# Patient Record
Sex: Female | Born: 1980 | Race: Black or African American | Hispanic: No | Marital: Single | Smoking: Never smoker
Health system: Southern US, Community
[De-identification: ages and names within clinical notes are randomized; demographics above are authoritative.]

## PROBLEM LIST (undated history)

## (undated) DIAGNOSIS — T4145XA Adverse effect of unspecified anesthetic, initial encounter: Secondary | ICD-10-CM

## (undated) DIAGNOSIS — Z789 Other specified health status: Secondary | ICD-10-CM

## (undated) DIAGNOSIS — T8859XA Other complications of anesthesia, initial encounter: Secondary | ICD-10-CM

## (undated) DIAGNOSIS — Z98891 History of uterine scar from previous surgery: Secondary | ICD-10-CM

## (undated) DIAGNOSIS — N856 Intrauterine synechiae: Secondary | ICD-10-CM

## (undated) HISTORY — PX: DILATION AND CURETTAGE OF UTERUS: SHX78

## (undated) HISTORY — DX: Intrauterine synechiae: N85.6

## (undated) HISTORY — DX: Adverse effect of unspecified anesthetic, initial encounter: T41.45XA

## (undated) HISTORY — DX: Other complications of anesthesia, initial encounter: T88.59XA

---

## 2003-05-18 DIAGNOSIS — N856 Intrauterine synechiae: Secondary | ICD-10-CM

## 2003-05-18 HISTORY — DX: Intrauterine synechiae: N85.6

## 2006-08-27 ENCOUNTER — Emergency Department (HOSPITAL_COMMUNITY): Admission: EM | Admit: 2006-08-27 | Discharge: 2006-08-27 | Payer: Self-pay | Admitting: Family Medicine

## 2007-08-07 ENCOUNTER — Ambulatory Visit (HOSPITAL_COMMUNITY): Admission: RE | Admit: 2007-08-07 | Discharge: 2007-08-07 | Payer: Self-pay | Admitting: Obstetrics & Gynecology

## 2008-11-28 ENCOUNTER — Encounter (INDEPENDENT_AMBULATORY_CARE_PROVIDER_SITE_OTHER): Payer: Self-pay | Admitting: *Deleted

## 2008-12-12 ENCOUNTER — Other Ambulatory Visit: Admission: RE | Admit: 2008-12-12 | Discharge: 2008-12-12 | Payer: Self-pay | Admitting: Family Medicine

## 2008-12-12 ENCOUNTER — Ambulatory Visit: Payer: Self-pay | Admitting: Family Medicine

## 2008-12-12 ENCOUNTER — Encounter: Payer: Self-pay | Admitting: Family Medicine

## 2008-12-13 ENCOUNTER — Ambulatory Visit: Payer: Self-pay | Admitting: Family Medicine

## 2008-12-16 ENCOUNTER — Encounter (INDEPENDENT_AMBULATORY_CARE_PROVIDER_SITE_OTHER): Payer: Self-pay | Admitting: *Deleted

## 2008-12-16 LAB — CONVERTED CEMR LAB
AST: 16 units/L (ref 0–37)
Alkaline Phosphatase: 48 units/L (ref 39–117)
Bilirubin, Direct: 0 mg/dL (ref 0.0–0.3)
Chloride: 109 meq/L (ref 96–112)
Eosinophils Absolute: 0.2 10*3/uL (ref 0.0–0.7)
Eosinophils Relative: 2.6 % (ref 0.0–5.0)
GFR calc non Af Amer: 85.7 mL/min (ref >60)
LDL Cholesterol: 57 mg/dL (ref 0–99)
Lymphocytes Relative: 42.6 % (ref 12.0–46.0)
MCHC: 33.7 g/dL (ref 30.0–36.0)
MCV: 81.9 fL (ref 78.0–100.0)
Monocytes Absolute: 0.4 10*3/uL (ref 0.1–1.0)
Neutrophils Relative %: 47.7 % (ref 43.0–77.0)
Platelets: 244 10*3/uL (ref 150.0–400.0)
Potassium: 3.9 meq/L (ref 3.5–5.1)
Sodium: 140 meq/L (ref 135–145)
TSH: 0.88 microintl units/mL (ref 0.35–5.50)
Total Bilirubin: 0.7 mg/dL (ref 0.3–1.2)
Total CHOL/HDL Ratio: 2
VLDL: 13 mg/dL (ref 0.0–40.0)
WBC: 6.4 10*3/uL (ref 4.5–10.5)

## 2008-12-20 ENCOUNTER — Encounter (INDEPENDENT_AMBULATORY_CARE_PROVIDER_SITE_OTHER): Payer: Self-pay | Admitting: *Deleted

## 2009-03-25 ENCOUNTER — Ambulatory Visit: Payer: Self-pay | Admitting: Family Medicine

## 2009-03-25 DIAGNOSIS — D6489 Other specified anemias: Secondary | ICD-10-CM | POA: Insufficient documentation

## 2009-03-25 DIAGNOSIS — N926 Irregular menstruation, unspecified: Secondary | ICD-10-CM | POA: Insufficient documentation

## 2009-03-25 LAB — CONVERTED CEMR LAB
Bilirubin Urine: NEGATIVE
Nitrite: NEGATIVE
Protein, U semiquant: NEGATIVE
Urobilinogen, UA: 0.2
WBC Urine, dipstick: NEGATIVE

## 2010-02-02 ENCOUNTER — Ambulatory Visit (HOSPITAL_COMMUNITY): Admission: RE | Admit: 2010-02-02 | Discharge: 2010-02-02 | Payer: Self-pay | Admitting: Gynecology

## 2010-03-23 ENCOUNTER — Emergency Department (HOSPITAL_COMMUNITY): Admission: EM | Admit: 2010-03-23 | Discharge: 2010-03-23 | Payer: Self-pay | Admitting: Emergency Medicine

## 2010-10-27 LAB — ABO/RH: RH Type: POSITIVE

## 2010-10-27 LAB — RUBELLA ANTIBODY, IGM: Rubella: IMMUNE

## 2010-10-27 LAB — GC/CHLAMYDIA PROBE AMP, GENITAL: Chlamydia: NEGATIVE

## 2010-10-27 LAB — ANTIBODY SCREEN: Antibody Screen: NEGATIVE

## 2011-05-20 ENCOUNTER — Inpatient Hospital Stay (HOSPITAL_COMMUNITY)
Admission: AD | Admit: 2011-05-20 | Discharge: 2011-05-20 | Disposition: A | Discharge status: Home / Self Care | Payer: BC Managed Care – PPO | Source: Ambulatory Visit | Attending: Obstetrics and Gynecology | Admitting: Obstetrics and Gynecology

## 2011-05-20 ENCOUNTER — Encounter (HOSPITAL_COMMUNITY): Payer: Self-pay | Admitting: *Deleted

## 2011-05-20 DIAGNOSIS — T2102XA Burn of unspecified degree of abdominal wall, initial encounter: Secondary | ICD-10-CM | POA: Insufficient documentation

## 2011-05-20 DIAGNOSIS — Y92009 Unspecified place in unspecified non-institutional (private) residence as the place of occurrence of the external cause: Secondary | ICD-10-CM | POA: Insufficient documentation

## 2011-05-20 DIAGNOSIS — T300 Burn of unspecified body region, unspecified degree: Secondary | ICD-10-CM

## 2011-05-20 DIAGNOSIS — O99891 Other specified diseases and conditions complicating pregnancy: Secondary | ICD-10-CM | POA: Insufficient documentation

## 2011-05-20 DIAGNOSIS — X118XXA Contact with other hot tap-water, initial encounter: Secondary | ICD-10-CM | POA: Insufficient documentation

## 2011-05-20 HISTORY — DX: Other specified health status: Z78.9

## 2011-05-20 MED ORDER — SILVER SULFADIAZINE 1 % EX CREA: TOPICAL_CREAM | Freq: Two times a day (BID) | CUTANEOUS | Status: DC

## 2011-05-20 MED ORDER — SILVER SULFADIAZINE 1 % EX CREA
TOPICAL_CREAM | Freq: Once | CUTANEOUS | Status: AC
  Administered 2011-05-20: 23:00:00 via TOPICAL
  Filled 2011-05-20: qty 50
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
## 2011-05-20 NOTE — Progress Notes (Signed)
HAS BEEN HAVING UC- VE IN OFFICE - CLOSED
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
## 2011-05-21 ENCOUNTER — Telehealth (HOSPITAL_COMMUNITY): Payer: Self-pay | Admitting: *Deleted

## 2011-05-21 ENCOUNTER — Encounter (HOSPITAL_COMMUNITY): Payer: Self-pay | Admitting: Advanced Practice Midwife

## 2011-05-21 ENCOUNTER — Encounter (HOSPITAL_COMMUNITY): Payer: Self-pay | Admitting: *Deleted

## 2011-05-21 NOTE — Telephone Encounter (Signed)
Preadmission screen  

## 2011-05-24 ENCOUNTER — Inpatient Hospital Stay (HOSPITAL_COMMUNITY)
Admission: AD | Admit: 2011-05-24 | Discharge: 2011-05-29 | DRG: 371 | Disposition: A | Discharge status: Home / Self Care | Payer: BC Managed Care – PPO | Source: Ambulatory Visit | Attending: Obstetrics and Gynecology | Admitting: Obstetrics and Gynecology

## 2011-05-24 ENCOUNTER — Encounter (HOSPITAL_COMMUNITY): Payer: Self-pay | Admitting: *Deleted

## 2011-05-24 DIAGNOSIS — Z22330 Carrier of Group B streptococcus: Secondary | ICD-10-CM

## 2011-05-24 DIAGNOSIS — O99892 Other specified diseases and conditions complicating childbirth: Secondary | ICD-10-CM | POA: Diagnosis present

## 2011-05-24 DIAGNOSIS — Z98891 History of uterine scar from previous surgery: Secondary | ICD-10-CM

## 2011-05-24 HISTORY — DX: History of uterine scar from previous surgery: Z98.891

## 2011-05-24 LAB — CBC
HCT: 30.4 % — ABNORMAL LOW (ref 36.0–46.0)
Hemoglobin: 9.8 g/dL — ABNORMAL LOW (ref 12.0–15.0)
MCH: 23.4 pg — ABNORMAL LOW (ref 26.0–34.0)
MCV: 72.7 fL — ABNORMAL LOW (ref 78.0–100.0)
Platelets: 275 10*3/uL (ref 150–400)
RBC: 4.18 MIL/uL (ref 3.87–5.11)
WBC: 9.7 10*3/uL (ref 4.0–10.5)

## 2011-05-24 MED ORDER — PENICILLIN G POTASSIUM 5000000 UNITS IJ SOLR: 2.5000 10*6.[IU] | INTRAVENOUS | Status: DC

## 2011-05-24 MED ORDER — DINOPROSTONE 10 MG VA INST
10.0000 mg | VAGINAL_INSERT | Freq: Once | VAGINAL | Status: AC
  Administered 2011-05-24: 10 mg via VAGINAL
  Filled 2011-05-24: qty 1

## 2011-05-24 MED ORDER — ZOLPIDEM TARTRATE 10 MG PO TABS
10.0000 mg | ORAL_TABLET | Freq: Every evening | ORAL | Status: DC | PRN
  Administered 2011-05-24: 10 mg via ORAL
  Filled 2011-05-24: qty 1

## 2011-05-24 MED ORDER — OXYTOCIN 20 UNITS IN LACTATED RINGERS INFUSION - SIMPLE
125.0000 mL/h | Freq: Once | INTRAVENOUS | Status: DC
  Administered 2011-05-26: 125 mL/h via INTRAVENOUS

## 2011-05-24 MED ORDER — LACTATED RINGERS IV SOLN
500.0000 mL | INTRAVENOUS | Status: DC | PRN
  Administered 2011-05-25: 1000 mL via INTRAVENOUS
  Administered 2011-05-26: 500 mL via INTRAVENOUS

## 2011-05-24 MED ORDER — PENICILLIN G POTASSIUM 5000000 UNITS IJ SOLR: 5.0000 10*6.[IU] | Freq: Once | INTRAVENOUS | Status: DC

## 2011-05-24 MED ORDER — OXYTOCIN BOLUS FROM INFUSION
500.0000 mL | Freq: Once | INTRAVENOUS | Status: DC
  Filled 2011-05-24: qty 500

## 2011-05-24 MED ORDER — LIDOCAINE HCL (PF) 1 % IJ SOLN: 30.0000 mL | INTRAMUSCULAR | Status: DC | PRN

## 2011-05-24 MED ORDER — BUTORPHANOL TARTRATE 2 MG/ML IJ SOLN
1.0000 mg | INTRAMUSCULAR | Status: DC | PRN
  Administered 2011-05-25 (×2): 1 mg via INTRAVENOUS
  Filled 2011-05-24 (×3): qty 1

## 2011-05-24 MED ORDER — IBUPROFEN 600 MG PO TABS: 600.0000 mg | ORAL_TABLET | Freq: Four times a day (QID) | ORAL | Status: DC | PRN

## 2011-05-24 MED ORDER — ONDANSETRON HCL 4 MG/2ML IJ SOLN
4.0000 mg | Freq: Four times a day (QID) | INTRAMUSCULAR | Status: DC | PRN
  Administered 2011-05-25: 4 mg via INTRAVENOUS
  Filled 2011-05-24: qty 2

## 2011-05-24 MED ORDER — TERBUTALINE SULFATE 1 MG/ML IJ SOLN: 0.2500 mg | Freq: Once | INTRAMUSCULAR | Status: AC | PRN

## 2011-05-24 MED ORDER — LACTATED RINGERS IV SOLN
INTRAVENOUS | Status: DC
  Administered 2011-05-25 (×4): via INTRAVENOUS

## 2011-05-24 MED ORDER — ACETAMINOPHEN 325 MG PO TABS: 650.0000 mg | ORAL_TABLET | ORAL | Status: DC | PRN

## 2011-05-24 MED ORDER — OXYCODONE-ACETAMINOPHEN 5-325 MG PO TABS: 2.0000 | ORAL_TABLET | ORAL | Status: DC | PRN

## 2011-05-24 MED ORDER — FLEET ENEMA 7-19 GM/118ML RE ENEM: 1.0000 | ENEMA | RECTAL | Status: DC | PRN

## 2011-05-24 MED ORDER — CITRIC ACID-SODIUM CITRATE 334-500 MG/5ML PO SOLN
30.0000 mL | ORAL | Status: DC | PRN
  Administered 2011-05-25 – 2011-05-26 (×2): 30 mL via ORAL
  Filled 2011-05-24 (×2): qty 15

## 2011-05-25 ENCOUNTER — Inpatient Hospital Stay (HOSPITAL_COMMUNITY): Admission: RE | Admit: 2011-05-25 | Payer: Self-pay | Source: Ambulatory Visit

## 2011-05-25 ENCOUNTER — Encounter (HOSPITAL_COMMUNITY): Payer: Self-pay | Admitting: Anesthesiology

## 2011-05-25 MED ORDER — PHENYLEPHRINE 40 MCG/ML (10ML) SYRINGE FOR IV PUSH (FOR BLOOD PRESSURE SUPPORT): 80.0000 ug | PREFILLED_SYRINGE | INTRAVENOUS | Status: DC | PRN

## 2011-05-25 MED ORDER — PHENYLEPHRINE 40 MCG/ML (10ML) SYRINGE FOR IV PUSH (FOR BLOOD PRESSURE SUPPORT)
80.0000 ug | PREFILLED_SYRINGE | INTRAVENOUS | Status: DC | PRN
  Filled 2011-05-25: qty 5

## 2011-05-25 MED ORDER — FENTANYL 2.5 MCG/ML BUPIVACAINE 1/10 % EPIDURAL INFUSION (WH - ANES)
INTRAMUSCULAR | Status: DC | PRN
  Administered 2011-05-25: 14 mL/h via EPIDURAL

## 2011-05-25 MED ORDER — OXYTOCIN 20 UNITS IN LACTATED RINGERS INFUSION - SIMPLE
1.0000 m[IU]/min | INTRAVENOUS | Status: DC
Start: 2011-05-25 — End: 2011-05-26
  Administered 2011-05-25: 19 m[IU]/min via INTRAVENOUS
  Administered 2011-05-25: 11 m[IU]/min via INTRAVENOUS
  Administered 2011-05-25: 1 m[IU]/min via INTRAVENOUS
  Filled 2011-05-25: qty 1000

## 2011-05-25 MED ORDER — PENICILLIN G POTASSIUM 5000000 UNITS IJ SOLR
5.0000 10*6.[IU] | Freq: Once | INTRAVENOUS | Status: AC
  Administered 2011-05-25: 5 10*6.[IU] via INTRAVENOUS
  Filled 2011-05-25: qty 5

## 2011-05-25 MED ORDER — PENICILLIN G POTASSIUM 5000000 UNITS IJ SOLR
2.5000 10*6.[IU] | INTRAMUSCULAR | Status: DC
  Administered 2011-05-25 – 2011-05-26 (×4): 2.5 10*6.[IU] via INTRAVENOUS
  Filled 2011-05-25 (×8): qty 2.5

## 2011-05-25 MED ORDER — EPHEDRINE 5 MG/ML INJ: 10.0000 mg | INTRAVENOUS | Status: DC | PRN

## 2011-05-25 MED ORDER — TERBUTALINE SULFATE 1 MG/ML IJ SOLN: 0.2500 mg | Freq: Once | INTRAMUSCULAR | Status: AC | PRN

## 2011-05-25 MED ORDER — LACTATED RINGERS IV SOLN: 500.0000 mL | Freq: Once | INTRAVENOUS | Status: DC

## 2011-05-25 MED ORDER — LIDOCAINE HCL 1.5 % IJ SOLN
INTRAMUSCULAR | Status: DC | PRN
  Administered 2011-05-25: 5 mL via EPIDURAL
  Administered 2011-05-25: 4 mL via EPIDURAL

## 2011-05-25 MED ORDER — DIPHENHYDRAMINE HCL 50 MG/ML IJ SOLN
12.5000 mg | INTRAMUSCULAR | Status: DC | PRN
Start: 2011-05-25 — End: 2011-05-26

## 2011-05-25 MED ORDER — FENTANYL 2.5 MCG/ML BUPIVACAINE 1/10 % EPIDURAL INFUSION (WH - ANES)
14.0000 mL/h | INTRAMUSCULAR | Status: DC
  Administered 2011-05-25 (×2): 14 mL/h via EPIDURAL
  Filled 2011-05-25 (×4): qty 60

## 2011-05-25 MED ORDER — EPHEDRINE 5 MG/ML INJ
10.0000 mg | INTRAVENOUS | Status: DC | PRN
  Filled 2011-05-25: qty 4

## 2011-05-25 NOTE — Progress Notes (Signed)
Patient ID: Gabrielle Rodgers, female   DOB: Feb 05, 1981, 31 y.o.   MRN: [id number] Pitocin at 19 mu/ minute and contractions q 2-4 and 1/2 minutes. Cervix 4 cm 80% effaced and the vertex is at -1 station Will continue to increase pitocin.

## 2011-05-25 NOTE — H&P (Signed)
Gabrielle Rodgers, LEHAN              ACCOUNT NO.:  [id number]  MEDICAL RECORD NO.:  [id number]  LOCATION:                                 FACILITY:  PHYSICIAN:  Malachi Pro. Ambrose Mantle, M.D. DATE OF BIRTH:  [birth year]  DATE OF ADMISSION:  05/24/2011 DATE OF DISCHARGE:                             HISTORY & PHYSICAL   PRESENT ILLNESS:  This is a 31 year old, African American female, para 0- 0-3-0, gravida 4, last menstrual period August 14, 2010, [facility] May 19, 2011, by an ultrasound at 9 weeks done on Oct 14, 2010.  Blood group and type O positive, negative antibody.  Pap smear normal.  Rubella immune. RPR nonreactive.  Hepatitis B surface antigen negative, HIV negative, hemoglobin, electrophoresis AA.  GC and Chlamydia negative.  Cystic fibrosis screening declined.  First trimester screen negative, AFP negative, group B strep positive, 1-hour Glucola 100.  The patient began her prenatal course with our office on October 27, 2010.  She was treated with MetroGel for BV early in pregnancy and she was then given a prescription for Diflucan.  She was also given Vicodin for headaches to take on an as needed basis for rescue if Tylenol does not help. Followup ultrasound at 18 weeks was normal.  She did have problems with carpal tunnel syndrome and was advised to use wrist splints.  She was later treated with metronidazole for BV on April 21, 2011.  She was advised of an 8 pounds weight gain in 2 weeks and advised a proper diet. She did have positive group B strep and plan is to treat her during labor.  We began her nonstress tests at her due date.  They have been reactive.  Her cervix has remained closed and only 30% effaced, but she is now at 41 weeks and she is admitted for induction to begin Cervidil on the night of admission.  PAST MEDICAL HISTORY:  No known drug allergies.  No latex allergy.  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  She has had a D and  C.  MEDICATIONS:  Prenatal vitamins.  OBSTETRIC HISTORY:  She has had 3 terminations and had a repeat D and C for retained products after the 1 in 2005.  FAMILY HISTORY:  Her mother has fibroids.  Father prostate cancer. Maternal grandmother with diabetes and alcoholism. Father and paternal grandmother have colon polyps.  Maternal grandmother with heart disease. Maternal aunt with multiple sclerosis and paternal uncle with lung cancer.  PHYSICAL EXAMINATION:  GENERAL:  A well-developed, well-nourished black female, in no distress. VITAL SIGNS:  Blood pressure 140/84, weight is 208.5 pounds. HEAD, EYES, NOSE AND THROAT:  Normal. HEART:  Normal size and sounds.  No murmurs. LUNGS:  Clear to auscultation.  Fundal height 39 cm.  Fetal heart tones normal.  Cervix 0 cm dilated, 30% effaced, vertex, at a -3.  The patient is being admitted for Cervidil and will undergo Pitocin induction on the day following admission.     Malachi Pro. Ambrose Mantle, M.D.     TFH/MEDQ  D:  05/24/2011  T:  05/24/2011  Job:  [id number]

## 2011-05-25 NOTE — Progress Notes (Signed)
Patient ID: Gabrielle Rodgers, female   DOB: 01/06/1981, 31 y.o.   MRN: [id number] Cervidil was placed last night at 10 PM Cervix FT dilated and 30-40% effaced and the vertex is at - 3 station. The cervidil was removed and we will start pitocin.

## 2011-05-25 NOTE — Anesthesia Procedure Notes (Signed)
Epidural Patient location during procedure: OB Start time: 05/25/2011 2:32 PM  Staffing Anesthesiologist: Khaniyah Bezek A. Performed by: anesthesiologist   Preanesthetic Checklist Completed: patient identified, site marked, surgical consent, pre-op evaluation, timeout performed, IV checked, risks and benefits discussed and monitors and equipment checked  Epidural Patient position: sitting Prep: site prepped and draped and DuraPrep Patient monitoring: continuous pulse ox and blood pressure Approach: midline Injection technique: LOR air  Needle:  Needle type: Tuohy  Needle gauge: 17 G Needle length: 9 cm Needle insertion depth: 5 cm cm Catheter type: closed end flexible Catheter size: 19 Gauge Catheter at skin depth: 10 cm Test dose: negative and 1.5% lidocaine  Assessment Events: blood not aspirated, injection not painful, no injection resistance, negative IV test and no paresthesia  Additional Notes Patient is more comfortable after epidural dosed. Please see RN's note for documentation of vital signs and FHR which are stable.

## 2011-05-25 NOTE — Progress Notes (Signed)
Patient ID: Gabrielle Rodgers, female   DOB: 01-Jul-1980, 31 y.o.   MRN: [id number] Pitocin at 17 mu/ minute. Gabrielle Rodgers has an epidural and is comfortable. Cervix 3-4 cm 70% effaced and the vertex is at - 2 station. The Gabrielle Rodgers was not examined for 4 hours so will increase the pitocin and check the cervix q 1 h

## 2011-05-25 NOTE — Progress Notes (Signed)
Patient ID: Gabrielle Rodgers, female   DOB: 1981/01/16, 31 y.o.   MRN: [id number] Pitocin at 11 mu/ minute and contractions are q 2-4 minutes. Cervix is 3 cm 80 % effaced and the vertex is at -2 station.AROM produced dark meconium stained fluid.

## 2011-05-25 NOTE — Progress Notes (Signed)
Patient ID: Gabrielle Rodgers, female   DOB: 1980-06-08, 31 y.o.   MRN: [id number] Pitocin is at 23 mu/ minute and the contractions are q  2- 3 and 1/2 minujtes The cervix is 4-5 cm 80 % effaced and the vertex is at -1

## 2011-05-25 NOTE — Progress Notes (Signed)
Patient ID: Gabrielle Rodgers, female   DOB: [birth year], 31 y.o.   MRN: [id number] With 21 mu/ minute of pitocin the contractions seem adequate and the pt feels pressure with the contractions on her perineum but the cervix is still 4 cm 80% effaced and the vertex is at -1 station.

## 2011-05-25 NOTE — Anesthesia Preprocedure Evaluation (Signed)
Anesthesia Evaluation  Patient identified by MRN, date of birth, ID band Patient awake    Reviewed: Allergy & Precautions, H&P , Patient's Chart, lab work & pertinent test results  Airway Mallampati: III TM Distance: >3 FB Neck ROM: full    Dental No notable dental hx. (+) Teeth Intact   Pulmonary neg pulmonary ROS,  clear to auscultation  Pulmonary exam normal       Cardiovascular neg cardio ROS regular Normal    Neuro/Psych Negative Neurological ROS  Negative Psych ROS   GI/Hepatic negative GI ROS, Neg liver ROS,   Endo/Other  Negative Endocrine ROS  Renal/GU negative Renal ROS  Genitourinary negative   Musculoskeletal   Abdominal Normal abdominal exam  (+)   Peds  Hematology negative hematology ROS (+)   Anesthesia Other Findings   Reproductive/Obstetrics (+) Pregnancy                           Anesthesia Physical Anesthesia Plan  ASA: II  Anesthesia Plan: Epidural   Post-op Pain Management:    Induction:   Airway Management Planned:   Additional Equipment:   Intra-op Plan:   Post-operative Plan:   Informed Consent: I have reviewed the patients History and Physical, chart, labs and discussed the procedure including the risks, benefits and alternatives for the proposed anesthesia with the patient or authorized representative who has indicated his/her understanding and acceptance.     Plan Discussed with: Anesthesiologist  Anesthesia Plan Comments:         Anesthesia Quick Evaluation  

## 2011-05-26 ENCOUNTER — Other Ambulatory Visit: Payer: Self-pay | Admitting: Obstetrics and Gynecology

## 2011-05-26 ENCOUNTER — Encounter (HOSPITAL_COMMUNITY): Payer: Self-pay | Admitting: *Deleted

## 2011-05-26 ENCOUNTER — Encounter (HOSPITAL_COMMUNITY)
Admission: AD | Disposition: A | Discharge status: Home / Self Care | Payer: Self-pay | Source: Ambulatory Visit | Attending: Obstetrics and Gynecology

## 2011-05-26 ENCOUNTER — Encounter (HOSPITAL_COMMUNITY): Payer: Self-pay | Admitting: Anesthesiology

## 2011-05-26 ENCOUNTER — Inpatient Hospital Stay (HOSPITAL_COMMUNITY): Payer: BC Managed Care – PPO | Admitting: Anesthesiology

## 2011-05-26 SURGERY — Surgical Case
Anesthesia: Epidural | Site: Abdomen | Wound class: Clean Contaminated

## 2011-05-26 MED ORDER — MEPERIDINE HCL 25 MG/ML IJ SOLN
INTRAMUSCULAR | Status: DC | PRN
  Administered 2011-05-26: 25 mg via INTRAVENOUS

## 2011-05-26 MED ORDER — ONDANSETRON HCL 4 MG/2ML IJ SOLN
INTRAMUSCULAR | Status: DC | PRN
  Administered 2011-05-26: 4 mg via INTRAVENOUS

## 2011-05-26 MED ORDER — ONDANSETRON HCL 4 MG/2ML IJ SOLN: 4.0000 mg | Freq: Three times a day (TID) | INTRAMUSCULAR | Status: DC | PRN

## 2011-05-26 MED ORDER — SODIUM BICARBONATE 8.4 % IV SOLN
INTRAVENOUS | Status: AC
  Filled 2011-05-26: qty 50

## 2011-05-26 MED ORDER — SCOPOLAMINE 1 MG/3DAYS TD PT72
1.0000 | MEDICATED_PATCH | Freq: Once | TRANSDERMAL | Status: AC
  Administered 2011-05-26: 1.5 mg via TRANSDERMAL

## 2011-05-26 MED ORDER — SODIUM CHLORIDE 0.9 % IV SOLN
1.0000 ug/kg/h | INTRAVENOUS | Status: DC | PRN
  Filled 2011-05-26: qty 2.5

## 2011-05-26 MED ORDER — WITCH HAZEL-GLYCERIN EX PADS: 1.0000 "application " | MEDICATED_PAD | CUTANEOUS | Status: DC | PRN

## 2011-05-26 MED ORDER — PHENYLEPHRINE HCL 10 MG/ML IJ SOLN
INTRAMUSCULAR | Status: DC | PRN
  Administered 2011-05-26: 80 ug via INTRAVENOUS

## 2011-05-26 MED ORDER — OXYTOCIN 10 UNIT/ML IJ SOLN
INTRAMUSCULAR | Status: AC
  Filled 2011-05-26: qty 4

## 2011-05-26 MED ORDER — MORPHINE SULFATE (PF) 0.5 MG/ML IJ SOLN
INTRAMUSCULAR | Status: DC | PRN
  Administered 2011-05-26: 4 mg via EPIDURAL
  Administered 2011-05-26: 1 mg via INTRAVENOUS

## 2011-05-26 MED ORDER — CEFAZOLIN SODIUM 1-5 GM-% IV SOLN
1.0000 g | Freq: Three times a day (TID) | INTRAVENOUS | Status: AC
  Administered 2011-05-26 (×2): 1 g via INTRAVENOUS
  Filled 2011-05-26 (×4): qty 50

## 2011-05-26 MED ORDER — SODIUM CHLORIDE 0.9 % IJ SOLN: 3.0000 mL | INTRAMUSCULAR | Status: DC | PRN

## 2011-05-26 MED ORDER — TETANUS-DIPHTH-ACELL PERTUSSIS 5-2.5-18.5 LF-MCG/0.5 IM SUSP
0.5000 mL | Freq: Once | INTRAMUSCULAR | Status: AC
  Administered 2011-05-27: 0.5 mL via INTRAMUSCULAR
  Filled 2011-05-26: qty 0.5

## 2011-05-26 MED ORDER — DIPHENHYDRAMINE HCL 25 MG PO CAPS
25.0000 mg | ORAL_CAPSULE | ORAL | Status: DC | PRN
  Administered 2011-05-26: 25 mg via ORAL
  Filled 2011-05-26 (×2): qty 1

## 2011-05-26 MED ORDER — SODIUM BICARBONATE 8.4 % IV SOLN
INTRAVENOUS | Status: DC | PRN
  Administered 2011-05-26: 5 mL via EPIDURAL

## 2011-05-26 MED ORDER — OXYTOCIN 20 UNITS IN LACTATED RINGERS INFUSION - SIMPLE
INTRAVENOUS | Status: AC
  Administered 2011-05-26: 20 [IU] via INTRAVENOUS
  Filled 2011-05-26: qty 1000

## 2011-05-26 MED ORDER — NALBUPHINE HCL 10 MG/ML IJ SOLN
5.0000 mg | INTRAMUSCULAR | Status: DC | PRN
  Administered 2011-05-26: 10 mg via SUBCUTANEOUS
  Administered 2011-05-26: 5 mg via SUBCUTANEOUS
  Administered 2011-05-26: 10 mg via SUBCUTANEOUS
  Filled 2011-05-26 (×4): qty 1

## 2011-05-26 MED ORDER — ZOLPIDEM TARTRATE 5 MG PO TABS: 5.0000 mg | ORAL_TABLET | Freq: Every evening | ORAL | Status: DC | PRN

## 2011-05-26 MED ORDER — CHLOROPROCAINE HCL 3 % IJ SOLN
INTRAMUSCULAR | Status: AC
Start: 2011-05-26 — End: 2011-05-26
  Filled 2011-05-26: qty 20

## 2011-05-26 MED ORDER — LIDOCAINE HCL (PF) 1 % IJ SOLN
INTRAMUSCULAR | Status: DC | PRN
  Administered 2011-05-26: 7 mL

## 2011-05-26 MED ORDER — LACTATED RINGERS IV SOLN
INTRAVENOUS | Status: DC | PRN
  Administered 2011-05-26 (×3): via INTRAVENOUS

## 2011-05-26 MED ORDER — ONDANSETRON HCL 4 MG/2ML IJ SOLN
INTRAMUSCULAR | Status: AC
  Filled 2011-05-26: qty 2

## 2011-05-26 MED ORDER — PHENYLEPHRINE 40 MCG/ML (10ML) SYRINGE FOR IV PUSH (FOR BLOOD PRESSURE SUPPORT)
PREFILLED_SYRINGE | INTRAVENOUS | Status: AC
  Filled 2011-05-26: qty 5

## 2011-05-26 MED ORDER — MENTHOL 3 MG MT LOZG: 1.0000 | LOZENGE | OROMUCOSAL | Status: DC | PRN

## 2011-05-26 MED ORDER — MEPERIDINE HCL 25 MG/ML IJ SOLN: 6.2500 mg | INTRAMUSCULAR | Status: DC | PRN

## 2011-05-26 MED ORDER — LIDOCAINE-EPINEPHRINE (PF) 2 %-1:200000 IJ SOLN
INTRAMUSCULAR | Status: AC
  Filled 2011-05-26: qty 20

## 2011-05-26 MED ORDER — KETOROLAC TROMETHAMINE 60 MG/2ML IM SOLN
60.0000 mg | Freq: Once | INTRAMUSCULAR | Status: AC | PRN
  Administered 2011-05-26: 60 mg via INTRAMUSCULAR

## 2011-05-26 MED ORDER — DIPHENHYDRAMINE HCL 50 MG/ML IJ SOLN: 25.0000 mg | INTRAMUSCULAR | Status: DC | PRN

## 2011-05-26 MED ORDER — SIMETHICONE 80 MG PO CHEW
80.0000 mg | CHEWABLE_TABLET | Freq: Three times a day (TID) | ORAL | Status: DC
  Administered 2011-05-26 – 2011-05-29 (×10): 80 mg via ORAL

## 2011-05-26 MED ORDER — IBUPROFEN 600 MG PO TABS: 600.0000 mg | ORAL_TABLET | Freq: Four times a day (QID) | ORAL | Status: DC | PRN

## 2011-05-26 MED ORDER — METOCLOPRAMIDE HCL 5 MG/ML IJ SOLN: 10.0000 mg | Freq: Three times a day (TID) | INTRAMUSCULAR | Status: DC | PRN

## 2011-05-26 MED ORDER — SODIUM CHLORIDE 0.9 % IR SOLN
Status: DC | PRN
  Administered 2011-05-26: 1000 mL

## 2011-05-26 MED ORDER — CEFAZOLIN SODIUM 1-5 GM-% IV SOLN
INTRAVENOUS | Status: AC
  Filled 2011-05-26: qty 100

## 2011-05-26 MED ORDER — SILVER SULFADIAZINE 1 % EX CREA
TOPICAL_CREAM | Freq: Two times a day (BID) | CUTANEOUS | Status: DC
  Administered 2011-05-26 – 2011-05-28 (×5): via TOPICAL
  Filled 2011-05-26: qty 50

## 2011-05-26 MED ORDER — OXYTOCIN 20 UNITS IN LACTATED RINGERS INFUSION - SIMPLE
INTRAVENOUS | Status: DC | PRN
  Administered 2011-05-26 (×2): 20 [IU] via INTRAVENOUS

## 2011-05-26 MED ORDER — KETOROLAC TROMETHAMINE 30 MG/ML IJ SOLN: 30.0000 mg | Freq: Four times a day (QID) | INTRAMUSCULAR | Status: AC | PRN

## 2011-05-26 MED ORDER — MORPHINE SULFATE 0.5 MG/ML IJ SOLN
INTRAMUSCULAR | Status: AC
  Filled 2011-05-26: qty 10

## 2011-05-26 MED ORDER — OXYCODONE-ACETAMINOPHEN 5-325 MG PO TABS
1.0000 | ORAL_TABLET | ORAL | Status: DC | PRN
  Administered 2011-05-26 – 2011-05-29 (×8): 1 via ORAL
  Filled 2011-05-26 (×9): qty 1

## 2011-05-26 MED ORDER — LACTATED RINGERS IV SOLN
INTRAVENOUS | Status: DC
Start: 2011-05-26 — End: 2011-05-29
  Administered 2011-05-26: 15:00:00 via INTRAVENOUS

## 2011-05-26 MED ORDER — MEASLES, MUMPS & RUBELLA VAC ~~LOC~~ INJ
0.5000 mL | INJECTION | Freq: Once | SUBCUTANEOUS | Status: DC
  Filled 2011-05-26: qty 0.5

## 2011-05-26 MED ORDER — LANOLIN HYDROUS EX OINT: 1.0000 "application " | TOPICAL_OINTMENT | CUTANEOUS | Status: DC | PRN

## 2011-05-26 MED ORDER — ONDANSETRON HCL 4 MG PO TABS: 4.0000 mg | ORAL_TABLET | ORAL | Status: DC | PRN

## 2011-05-26 MED ORDER — ONDANSETRON HCL 4 MG/2ML IJ SOLN: 4.0000 mg | INTRAMUSCULAR | Status: DC | PRN

## 2011-05-26 MED ORDER — DIPHENHYDRAMINE HCL 50 MG/ML IJ SOLN
12.5000 mg | INTRAMUSCULAR | Status: DC | PRN
  Administered 2011-05-26: 12.5 mg via INTRAVENOUS

## 2011-05-26 MED ORDER — MEPERIDINE HCL 25 MG/ML IJ SOLN
INTRAMUSCULAR | Status: AC
  Filled 2011-05-26: qty 1

## 2011-05-26 MED ORDER — SENNOSIDES-DOCUSATE SODIUM 8.6-50 MG PO TABS
2.0000 | ORAL_TABLET | Freq: Every day | ORAL | Status: DC
  Administered 2011-05-26 – 2011-05-28 (×3): 2 via ORAL

## 2011-05-26 MED ORDER — IBUPROFEN 600 MG PO TABS
600.0000 mg | ORAL_TABLET | Freq: Four times a day (QID) | ORAL | Status: DC
  Administered 2011-05-26 – 2011-05-29 (×11): 600 mg via ORAL
  Filled 2011-05-26 (×13): qty 1

## 2011-05-26 MED ORDER — DIBUCAINE 1 % RE OINT: 1.0000 "application " | TOPICAL_OINTMENT | RECTAL | Status: DC | PRN

## 2011-05-26 MED ORDER — OXYTOCIN 20 UNITS IN LACTATED RINGERS INFUSION - SIMPLE: 125.0000 mL/h | INTRAVENOUS | Status: AC

## 2011-05-26 MED ORDER — KETOROLAC TROMETHAMINE 60 MG/2ML IM SOLN
INTRAMUSCULAR | Status: AC
  Filled 2011-05-26: qty 2

## 2011-05-26 MED ORDER — CEFAZOLIN SODIUM 1-5 GM-% IV SOLN
INTRAVENOUS | Status: DC | PRN
  Administered 2011-05-26: 2 g via INTRAVENOUS

## 2011-05-26 MED ORDER — NALOXONE HCL 0.4 MG/ML IJ SOLN: 0.4000 mg | INTRAMUSCULAR | Status: DC | PRN

## 2011-05-26 MED ORDER — SCOPOLAMINE 1 MG/3DAYS TD PT72
MEDICATED_PATCH | TRANSDERMAL | Status: AC
  Filled 2011-05-26: qty 1

## 2011-05-26 MED ORDER — NALBUPHINE SYRINGE 5 MG/0.5 ML
5.0000 mg | INJECTION | INTRAMUSCULAR | Status: DC | PRN
  Administered 2011-05-26: 10 mg via INTRAVENOUS
  Filled 2011-05-26 (×3): qty 1

## 2011-05-26 MED ORDER — SIMETHICONE 80 MG PO CHEW: 80.0000 mg | CHEWABLE_TABLET | ORAL | Status: DC | PRN

## 2011-05-26 MED ORDER — CEFAZOLIN SODIUM-DEXTROSE 2-3 GM-% IV SOLR: 2.0000 g | Freq: Once | INTRAVENOUS | Status: DC

## 2011-05-26 MED ORDER — DIPHENHYDRAMINE HCL 25 MG PO CAPS: 25.0000 mg | ORAL_CAPSULE | Freq: Four times a day (QID) | ORAL | Status: DC | PRN

## 2011-05-26 MED ORDER — DIPHENHYDRAMINE HCL 50 MG/ML IJ SOLN
INTRAMUSCULAR | Status: AC
  Filled 2011-05-26: qty 1

## 2011-05-26 SURGICAL SUPPLY — 36 items
CLOTH BEACON ORANGE TIMEOUT ST (SAFETY) ×2 IMPLANT
CONTAINER PREFILL 10% NBF 15ML (MISCELLANEOUS) IMPLANT
DRESSING TELFA 8X3 (GAUZE/BANDAGES/DRESSINGS) IMPLANT
DRSG VASELINE 3X18 (GAUZE/BANDAGES/DRESSINGS) ×2 IMPLANT
ELECT REM PT RETURN 9FT ADLT (ELECTROSURGICAL) ×2
ELECTRODE REM PT RTRN 9FT ADLT (ELECTROSURGICAL) ×1 IMPLANT
EXTRACTOR VACUUM KIWI (MISCELLANEOUS) IMPLANT
EXTRACTOR VACUUM M CUP 4 TUBE (SUCTIONS) IMPLANT
GAUZE SPONGE 4X4 12PLY STRL LF (GAUZE/BANDAGES/DRESSINGS) ×3 IMPLANT
GAUZE VASELINE 3X9 (GAUZE/BANDAGES/DRESSINGS) ×1 IMPLANT
GLOVE BIO SURGEON STRL SZ7.5 (GLOVE) ×4 IMPLANT
GLOVE SKINSENSE NS SZ7.5 (GLOVE) ×1
GLOVE SKINSENSE NS SZ8.0 LF (GLOVE) ×1
GLOVE SKINSENSE STRL SZ7.5 (GLOVE) IMPLANT
GLOVE SKINSENSE STRL SZ8.0 LF (GLOVE) IMPLANT
GOWN PREVENTION PLUS LG XLONG (DISPOSABLE) ×4 IMPLANT
GOWN PREVENTION PLUS XLARGE (GOWN DISPOSABLE) ×2 IMPLANT
KIT ABG SYR 3ML LUER SLIP (SYRINGE) IMPLANT
NDL HYPO 25X5/8 SAFETYGLIDE (NEEDLE) ×1 IMPLANT
NEEDLE HYPO 25X5/8 SAFETYGLIDE (NEEDLE) IMPLANT
NS IRRIG 1000ML POUR BTL (IV SOLUTION) ×2 IMPLANT
PACK C SECTION WH (CUSTOM PROCEDURE TRAY) ×2 IMPLANT
PAD ABD 7.5X8 STRL (GAUZE/BANDAGES/DRESSINGS) IMPLANT
RTRCTR C-SECT PINK 25CM LRG (MISCELLANEOUS) ×1 IMPLANT
SLEEVE SCD COMPRESS KNEE MED (MISCELLANEOUS) ×1 IMPLANT
STAPLER VISISTAT 35W (STAPLE) ×1 IMPLANT
SUT PLAIN 0 NONE (SUTURE) IMPLANT
SUT VIC AB 0 CT1 36 (SUTURE) ×16 IMPLANT
SUT VIC AB 3-0 CTX 36 (SUTURE) ×2 IMPLANT
SUT VIC AB 3-0 SH 27 (SUTURE)
SUT VIC AB 3-0 SH 27X BRD (SUTURE) IMPLANT
SUT VIC AB 4-0 KS 27 (SUTURE) IMPLANT
SUT VICRYL 0 TIES 12 18 (SUTURE) IMPLANT
TOWEL OR 17X24 6PK STRL BLUE (TOWEL DISPOSABLE) ×4 IMPLANT
TRAY FOLEY CATH 14FR (SET/KITS/TRAYS/PACK) ×1 IMPLANT
WATER STERILE IRR 1000ML POUR (IV SOLUTION) ×1 IMPLANT

## 2011-05-26 NOTE — Preoperative (Signed)
Beta Blockers   Reason not to administer Beta Blockers:Not Applicable 

## 2011-05-26 NOTE — Progress Notes (Signed)
UR chart review completed.  

## 2011-05-26 NOTE — Anesthesia Postprocedure Evaluation (Signed)
Anesthesia Post Note  Patient: Gabrielle Rodgers  Procedure(s) Performed:  CESAREAN SECTION - Primary Cesarean Section Delivery Boy @ [phone], Apgars 9/10  Anesthesia type: Epidural  Patient location: PACU  Post pain: Pain level controlled  Post assessment: Post-op Vital signs reviewed  Last Vitals:  Filed Vitals:   05/26/11 0445  BP: 127/78  Pulse: 91  Temp:   Resp: 18    Post vital signs: stable  Level of consciousness: awake  Complications: No apparent anesthesia complications

## 2011-05-26 NOTE — Anesthesia Postprocedure Evaluation (Signed)
  Anesthesia Post-op Note  Patient: Gabrielle Rodgers  Procedure(s) Performed:  CESAREAN SECTION - Primary Cesarean Section Delivery Boy @ [phone], Apgars 9/10  Patient Location: Mother/Baby  Anesthesia Type: Epidural  Level of Consciousness: alert  and oriented  Airway and Oxygen Therapy: Patient Spontanous Breathing  Post-op Pain: mild  Post-op Assessment: Patient's Cardiovascular Status Stable and Respiratory Function Stable  Post-op Vital Signs: stable  Complications: No apparent anesthesia complications

## 2011-05-26 NOTE — Transfer of Care (Signed)
Immediate Anesthesia Transfer of Care Note  Patient: Gabrielle Rodgers  Procedure(s) Performed:  CESAREAN SECTION - Primary Cesarean Section Delivery Boy @ [phone], Apgars 9/10  Patient Location: PACU  Anesthesia Type: Epidural  Level of Consciousness: awake, alert , oriented and patient cooperative  Airway & Oxygen Therapy: Patient Spontanous Breathing  Post-op Assessment: Report given to PACU RN  Post vital signs: Reviewed  Complications: No apparent anesthesia complications

## 2011-05-26 NOTE — Addendum Note (Signed)
Addendum  created 05/26/11 1527 by Fanny Dance   Modules edited:Notes Section

## 2011-05-26 NOTE — Plan of Care (Signed)
Problem: Phase I Progression Outcomes Goal: Adequate progression of labor Outcome: Not Progressing C/S for failure to progress

## 2011-05-26 NOTE — Op Note (Signed)
Operative note on Gabrielle Rodgers:  Preoperative diagnosis: Intrauterine pregnancy 41 weeks failure to progress in labor  Postoperative diagnosis: Same, occiput posterior  Operation: Low transverse cervical C-section  Operator: Astra Gregg   Anesthesia: Epidural Dr. Rodman Pickle  Date of the operation 05/26/2011  The patient was brought to the operating room and the epidural was boosted and after anesthesia was satisfactory the abdomen was prepped with Betadine solution and draped as a sterile field. A timeout was done. A transverse incision was made suprapubically and carried in layers through the skin subcutaneous tissue and fascia the fascia was separated from the rectus muscle superiorly and inferiorly and the peritoneum was opened the peritoneum was stretched and the Alexis retractor was inserted into the abdominal cavity exposing the lower uterine segment. A short transverse incision was made through the superficial layers of the myometrium in the lower uterine segment I entered the amniotic sac with my finger and meconium-stained fluid was released. The incision was over the shoulder Sahr reached down in the pelvis rotated the babies head to transverse and delivered the vertex. The nose and pharynx were suctioned with the bulb. I had asked Dr. Eric Form prior to surgery If he wanted me to use the DeLee and he stated he did not. The remainder of the baby was delivered, the cord was clamped, and the infant was given to Dr. Dorene Grebe who was in attendance. ESI in the 8 lbs. 13 oz. Female infant Apgars of 9 at one and 10 at 5 minutes. The placenta was removed, all membranes were removed, the inside of the uterus was palpated and found to be free of any remaining products of conception, and the uterine incision was closed with 2 sutures of 0 Vicryl locking the first layer and a nonlocking imbricating suture on the second layer. Both tubes and ovaries and the uterus appeared normal, Liberal irrigation confirmed  hemostasis, the Alexis retractor was removed And the abdominal wall was closed in layers using interrupted sutures of 0 Vicryl to close the rectus muscle and peritoneum in one layer 2 running sutures of 0 Vicryl on the fascia a running 3-0 Vicryl in the subcutaneous tissue and staples on the skin The patient complained of a lot of pain as I was closing the abdominal wall so Nesacaine was poured into the incision and about 8 cc of 1% Xylocaine were injected into the subcutaneous tissue.The procedure was terminated, estimated blood loss about 1000 cc. Sponge and needle counts were correct and she was returned to recovery in satisfactory condition. During the procedure I identified the bladder and it was well below the area of suturing

## 2011-05-26 NOTE — Progress Notes (Signed)
Patient ID: Gabrielle Rodgers, female   DOB: 11-26-1980, 31 y.o.   MRN: [id number] DOS afebrile BP normal No complaints Output good

## 2011-05-26 NOTE — Progress Notes (Signed)
Patient ID: Gabrielle Rodgers, female   DOB: 11/17/1980, 31 y.o.   MRN: [id number] An entry was made at midnight by paper stating that I had advised Csection but the pt declined stating she wanted to wait until 3 AM Since then the baby has begun to have slight decelerations that are somewhat  Late in timing. The cervix is 4-5 cm 80% effaced and the vertex is at -1 station. We will proceed with C section.

## 2011-05-27 ENCOUNTER — Encounter (HOSPITAL_COMMUNITY): Payer: Self-pay | Admitting: Obstetrics and Gynecology

## 2011-05-27 LAB — CBC
MCH: 23.7 pg — ABNORMAL LOW (ref 26.0–34.0)
MCHC: 31.8 g/dL (ref 30.0–36.0)
MCV: 74.5 fL — ABNORMAL LOW (ref 78.0–100.0)
Platelets: 242 10*3/uL (ref 150–400)
RDW: 17.8 % — ABNORMAL HIGH (ref 11.5–15.5)

## 2011-05-27 MED ORDER — INFLUENZA VIRUS VACC SPLIT PF IM SUSP
0.5000 mL | INTRAMUSCULAR | Status: AC
  Filled 2011-05-27: qty 0.5

## 2011-05-27 NOTE — Progress Notes (Signed)
Patient ID: Gabrielle Rodgers, female   DOB: [birth year], 31 y.o.   MRN: [id number] #1 afebrile BP normal Has passed flatus on only 1 occasion and feels bloated. The abdomen is distended but soft HGB 9.8 to 7.7

## 2011-05-28 NOTE — Progress Notes (Signed)
Patient ID: Gabrielle Rodgers, female   DOB: 1980-07-21, 31 y.o.   MRN: [id number] #2 afebrile doing well Pt does not want d/c today.

## 2011-05-29 ENCOUNTER — Encounter (HOSPITAL_COMMUNITY): Payer: Self-pay | Admitting: Obstetrics and Gynecology

## 2011-05-29 DIAGNOSIS — Z98891 History of uterine scar from previous surgery: Secondary | ICD-10-CM

## 2011-05-29 HISTORY — DX: History of uterine scar from previous surgery: Z98.891

## 2011-05-29 MED ORDER — IBUPROFEN 800 MG PO TABS: 800.0000 mg | ORAL_TABLET | Freq: Four times a day (QID) | ORAL | Status: AC

## 2011-05-29 MED ORDER — PRENATAL RX 60-1 MG PO TABS: 1.0000 | ORAL_TABLET | Freq: Every day | ORAL | Status: DC

## 2011-05-29 MED ORDER — OXYCODONE-ACETAMINOPHEN 5-325 MG PO TABS: ORAL_TABLET | ORAL | Status: DC

## 2011-05-29 NOTE — Discharge Summary (Signed)
Obstetric Discharge Summary Reason for Admission: induction of labor Prenatal Procedures: none Intrapartum Procedures: cesarean: low cervical, transverse Postpartum Procedures: none Complications-Operative and Postpartum: none Hemoglobin  Date Value Range Status  05/27/2011 7.7* 12.0-15.0 (g/dL) Final     HCT  Date Value Range Status  05/27/2011 24.2* 36.0-46.0 (%) Final    Discharge Diagnoses: Term Pregnancy-delivered  Discharge Information: Date: 05/29/2011 Activity: pelvic rest Diet: routine Medications: PNV, Ibuprofen and Percocet Condition: stable Instructions: refer to practice specific booklet Discharge to: home Follow-up Information    Follow up with Bing Plume, MD. Schedule an appointment as soon as possible for a visit in 2 weeks.   Contact information:   [employer] [address] [place], [address] [place] [address] [phone]          Newborn Data: Live born female  Birth Weight: 8 lb 13.3 oz (4005 g) APGAR: 9, 10  Home with mother.  BOVARD,Nathaneil Feagans 05/29/2011, 12:00 PM

## 2011-05-29 NOTE — Progress Notes (Addendum)
Subjective: Postpartum Day 3: Cesarean Delivery Patient reports incisional pain, tolerating PO, + flatus and no problems voiding. Nl lochia, pain controlled   Objective: Vital signs in last 24 hours: Temp:  [98.2 F (36.8 C)-98.5 F (36.9 C)] 98.2 F (36.8 C) (01/12 0531) Pulse Rate:  [85-111] 93  (01/12 0531) Resp:  [18] 18  (01/12 0531) BP: (120-132)/(71-92) 132/92 mmHg (01/12 0531) SpO2:  [97 %-99 %] 97 % (01/12 0531)  Physical Exam:  General: alert and no distress Lochia: appropriate Uterine Fundus: firm Incision: healing well, staples removed, steri-strips applied DVT Evaluation: No evidence of DVT seen on physical exam.   Basename 05/27/11 0500  HGB 7.7*  HCT 24.2*    Assessment/Plan: Status post Cesarean section. Doing well postoperatively.  Discharge home with standard precautions and return to clinic in 2 weeks. Discharge with Motrin/Percocet/PNV  BOVARD,Donda Friedli 05/29/2011, 11:48 AM

## 2011-08-11 ENCOUNTER — Emergency Department (HOSPITAL_COMMUNITY)
Admission: EM | Admit: 2011-08-11 | Discharge: 2011-08-12 | Disposition: A | Discharge status: Home / Self Care | Payer: BC Managed Care – PPO | Attending: Emergency Medicine | Admitting: Emergency Medicine

## 2011-08-11 ENCOUNTER — Encounter (HOSPITAL_COMMUNITY): Payer: Self-pay | Admitting: *Deleted

## 2011-08-11 DIAGNOSIS — T22219A Burn of second degree of unspecified forearm, initial encounter: Secondary | ICD-10-CM | POA: Insufficient documentation

## 2011-08-11 DIAGNOSIS — T23209A Burn of second degree of unspecified hand, unspecified site, initial encounter: Secondary | ICD-10-CM | POA: Insufficient documentation

## 2011-08-11 DIAGNOSIS — X12XXXA Contact with other hot fluids, initial encounter: Secondary | ICD-10-CM | POA: Insufficient documentation

## 2011-08-11 DIAGNOSIS — T23259A Burn of second degree of unspecified palm, initial encounter: Secondary | ICD-10-CM | POA: Insufficient documentation

## 2011-08-11 DIAGNOSIS — T2200XA Burn of unspecified degree of shoulder and upper limb, except wrist and hand, unspecified site, initial encounter: Secondary | ICD-10-CM

## 2011-08-11 DIAGNOSIS — Y93G3 Activity, cooking and baking: Secondary | ICD-10-CM | POA: Insufficient documentation

## 2011-08-11 MED ORDER — KETOROLAC TROMETHAMINE 30 MG/ML IJ SOLN
INTRAMUSCULAR | Status: AC
  Filled 2011-08-11: qty 1

## 2011-08-11 MED ORDER — KETOROLAC TROMETHAMINE 30 MG/ML IJ SOLN
30.0000 mg | Freq: Once | INTRAMUSCULAR | Status: AC
  Administered 2011-08-11: 30 mg via INTRAMUSCULAR

## 2011-08-11 MED ORDER — HYDROMORPHONE HCL PF 1 MG/ML IJ SOLN
INTRAMUSCULAR | Status: AC
  Filled 2011-08-11: qty 1

## 2011-08-11 MED ORDER — HYDROMORPHONE HCL PF 1 MG/ML IJ SOLN
1.0000 mg | Freq: Once | INTRAMUSCULAR | Status: AC
  Administered 2011-08-11: 1 mg via INTRAMUSCULAR
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
## 2011-08-12 MED ORDER — OXYCODONE-ACETAMINOPHEN 5-325 MG PO TABS: 1.0000 | ORAL_TABLET | Freq: Four times a day (QID) | ORAL | Status: AC | PRN

## 2011-08-12 MED ORDER — SILVER SULFADIAZINE 1 % EX CREA
TOPICAL_CREAM | Freq: Once | CUTANEOUS | Status: AC
  Administered 2011-08-12: 01:00:00 via TOPICAL
  Filled 2011-08-12: qty 85
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
## 2011-09-07 IMAGING — RF DG HYSTEROGRAM
5 series · 5 of 5 positions shown · non-contrast
Comparison: none

CLINICAL DATA: Infertility

HYSTEROSALPINGOGRAM
TECHNIQUE: Hysterosalpingogram was performed by the ordering
physician under fluoroscopy.  Fluoroscopic images are submitted for
interpretation following the procedure.
Fluoroscopy Time:  2.1 minutes.

[Series 1: run · 1 of 1 slices shown (1 of 5)]
[im 1/1]
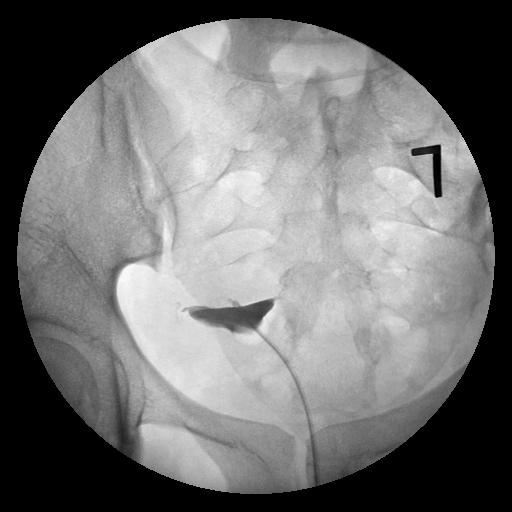

[Series 2: run · 1 of 1 slices shown (2 of 5)]
[im 1/1]
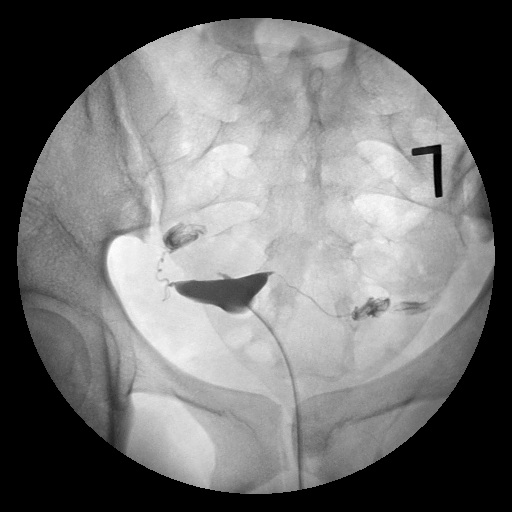

[Series 3: run · 1 of 1 slices shown (3 of 5)]
[im 1/1]
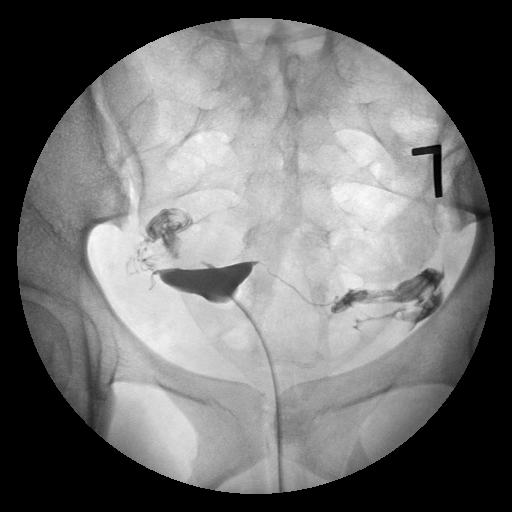

[Series 4: run · 1 of 1 slices shown (4 of 5)]
[im 1/1]
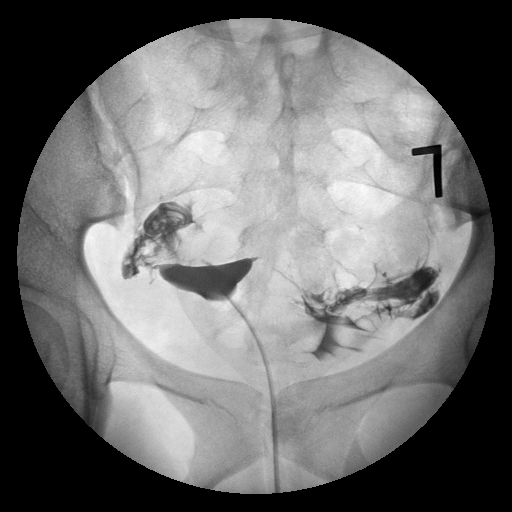

[Series 5: run · 1 of 1 slices shown (5 of 5)]
[im 1/1]
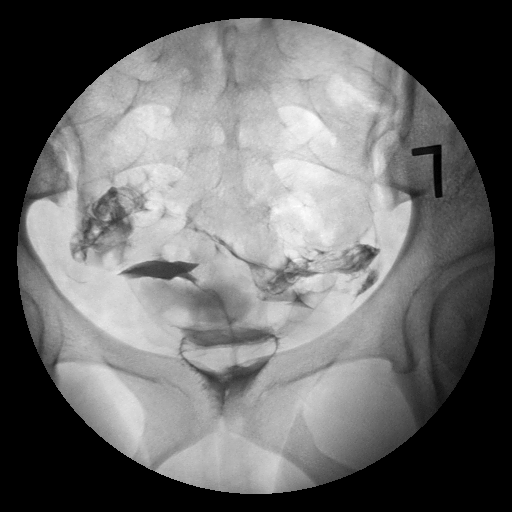

[5 of 5 positions shown; findings below may reference images not displayed]

FINDINGS: The endometrial cavity of the uterus is normal in contour
and appearance.

Contrast filling of both fallopian tubes is seen, and both tubes
are normal in appearance.  Intraperitoneal spill of the contrast
from both fallopian tubes is demonstrated.
IMPRESSION: Normal study.  Fallopian tubes are patent bilaterally.

## 2012-07-14 ENCOUNTER — Ambulatory Visit: Payer: BC Managed Care – PPO | Admitting: Family Medicine

## 2012-07-14 DIAGNOSIS — Z0289 Encounter for other administrative examinations: Secondary | ICD-10-CM

## 2012-07-17 ENCOUNTER — Ambulatory Visit (INDEPENDENT_AMBULATORY_CARE_PROVIDER_SITE_OTHER): Payer: BC Managed Care – PPO | Admitting: Family Medicine

## 2012-07-17 ENCOUNTER — Encounter: Payer: Self-pay | Admitting: Family Medicine

## 2012-07-17 VITALS — BP 114/62 | HR 87 | Temp 98.1°F | Wt 157.8 lb

## 2012-07-17 DIAGNOSIS — D179 Benign lipomatous neoplasm, unspecified: Secondary | ICD-10-CM

## 2012-07-17 NOTE — Progress Notes (Signed)
  Subjective:    Patient ID: Gabrielle Rodgers, female    DOB: December 08, 1980, 32 y.o.   MRN: [id number]  HPI Pt here c/o 2 soft tissue masses -- 1 on L hip and 1 on R upper thigh.  Pt states they have only been there about 1-2 months but they are starting to bother her.    Review of Systems    as above Objective:   Physical Exam  BP 114/62  Pulse 87  Temp(Src) 98.1 F (36.7 C) (Tympanic)  Wt 157 lb 12.8 oz (71.578 kg)  BMI 25.48 kg/m2  SpO2 98% General appearance: alert, cooperative, appears stated age and no distress Skin: l hip--- soft fatty nodule about 3/4 in diameter   back of upper R thigh--+ soft fatty tumor about 2 in long      Assessment & Plan:  ? Lipomas---- refer to plastic surgeon for eval

## 2012-07-17 NOTE — Patient Instructions (Signed)
Lipoma  A lipoma is a noncancerous (benign) tumor composed of fat cells. They are usually found under the skin (subcutaneous). A lipoma may occur in any tissue of the body that contains fat. Common areas for lipomas to appear include the back, shoulders, buttocks, and thighs. Lipomas are a very common soft tissue growth. They are soft and grow slowly. Most problems caused by a lipoma depend on where it is growing.  DIAGNOSIS   A lipoma can be diagnosed with a physical exam. These tumors rarely become cancerous, but radiographic studies can help determine this for certain. Studies used may include:   Computerized X-ray scans (CT or CAT scan).   Computerized magnetic scans (MRI).  TREATMENT   Small lipomas that are not causing problems may be watched. If a lipoma continues to enlarge or causes problems, removal is often the best treatment. Lipomas can also be removed to improve appearance. Surgery is done to remove the fatty cells and the surrounding capsule. Most often, this is done with medicine that numbs the area (local anesthetic). The removed tissue is examined under a microscope to make sure it is not cancerous. Keep all follow-up appointments with your caregiver.  SEEK MEDICAL CARE IF:    The lipoma becomes larger or hard.   The lipoma becomes painful, red, or increasingly swollen. These could be signs of infection or a more serious condition.  Document Released: 04/23/2002 Document Revised: 07/26/2011 Document Reviewed: 10/03/2009  [employer] Patient Information 2013 [employer]

## 2012-07-19 ENCOUNTER — Other Ambulatory Visit: Payer: Self-pay | Admitting: Family Medicine

## 2012-07-19 DIAGNOSIS — D179 Benign lipomatous neoplasm, unspecified: Secondary | ICD-10-CM

## 2012-09-11 ENCOUNTER — Ambulatory Visit (INDEPENDENT_AMBULATORY_CARE_PROVIDER_SITE_OTHER): Payer: BC Managed Care – PPO | Admitting: Family Medicine

## 2012-09-11 ENCOUNTER — Encounter: Payer: Self-pay | Admitting: Family Medicine

## 2012-09-11 VITALS — BP 98/64 | HR 73 | Temp 98.4°F | Ht 65.75 in | Wt 156.8 lb

## 2012-09-11 DIAGNOSIS — Z0000 Encounter for general adult medical examination without abnormal findings: Secondary | ICD-10-CM

## 2012-09-11 LAB — BASIC METABOLIC PANEL
BUN: 15 mg/dL (ref 6–23)
Chloride: 106 mEq/L (ref 96–112)
Potassium: 3.3 mEq/L — ABNORMAL LOW (ref 3.5–5.1)
Sodium: 138 mEq/L (ref 135–145)

## 2012-09-11 LAB — CBC WITH DIFFERENTIAL/PLATELET
Basophils Relative: 0.7 % (ref 0.0–3.0)
Eosinophils Relative: 2.9 % (ref 0.0–5.0)
HCT: 40.8 % (ref 36.0–46.0)
Hemoglobin: 13.6 g/dL (ref 12.0–15.0)
Lymphs Abs: 3 10*3/uL (ref 0.7–4.0)
MCV: 88 fl (ref 78.0–100.0)
Monocytes Absolute: 0.3 10*3/uL (ref 0.1–1.0)
Neutro Abs: 3 10*3/uL (ref 1.4–7.7)
Platelets: 226 10*3/uL (ref 150.0–400.0)
RBC: 4.64 Mil/uL (ref 3.87–5.11)
WBC: 6.4 10*3/uL (ref 4.5–10.5)

## 2012-09-11 LAB — LIPID PANEL
Cholesterol: 115 mg/dL (ref 0–200)
Triglycerides: 37 mg/dL (ref 0.0–149.0)

## 2012-09-11 LAB — TSH: TSH: 0.5 u[IU]/mL (ref 0.35–5.50)

## 2012-09-11 LAB — HEPATIC FUNCTION PANEL
ALT: 14 U/L (ref 0–35)
AST: 23 U/L (ref 0–37)
Albumin: 4 g/dL (ref 3.5–5.2)
Total Protein: 7.2 g/dL (ref 6.0–8.3)

## 2012-09-11 NOTE — Patient Instructions (Addendum)
Preventive Care for Adults, Female A healthy lifestyle and preventive care can promote health and wellness. Preventive health guidelines for women include the following key practices.  A routine yearly physical is a good way to check with your caregiver about your health and preventive screening. It is a chance to share any concerns and updates on your health, and to receive a thorough exam.  Visit your dentist for a routine exam and preventive care every 6 months. Brush your teeth twice a day and floss once a day. Good oral hygiene prevents tooth decay and gum disease.  The frequency of eye exams is based on your age, health, family medical history, use of contact lenses, and other factors. Follow your caregiver's recommendations for frequency of eye exams.  Eat a healthy diet. Foods like vegetables, fruits, whole grains, low-fat dairy products, and lean protein foods contain the nutrients you need without too many calories. Decrease your intake of foods high in solid fats, added sugars, and salt. Eat the right amount of calories for you.Get information about a proper diet from your caregiver, if necessary.  Regular physical exercise is one of the most important things you can do for your health. Most adults should get at least 150 minutes of moderate-intensity exercise (any activity that increases your heart rate and causes you to sweat) each week. In addition, most adults need muscle-strengthening exercises on 2 or more days a week.  Maintain a healthy weight. The body mass index (BMI) is a screening tool to identify possible weight problems. It provides an estimate of body fat based on height and weight. Your caregiver can help determine your BMI, and can help you achieve or maintain a healthy weight.For adults 20 years and older:  A BMI below 18.5 is considered underweight.  A BMI of 18.5 to 24.9 is normal.  A BMI of 25 to 29.9 is considered overweight.  A BMI of 30 and above is  considered obese.  Maintain normal blood lipids and cholesterol levels by exercising and minimizing your intake of saturated fat. Eat a balanced diet with plenty of fruit and vegetables. Blood tests for lipids and cholesterol should begin at age 20 and be repeated every 5 years. If your lipid or cholesterol levels are high, you are over 50, or you are at high risk for heart disease, you may need your cholesterol levels checked more frequently.Ongoing high lipid and cholesterol levels should be treated with medicines if diet and exercise are not effective.  If you smoke, find out from your caregiver how to quit. If you do not use tobacco, do not start.  If you are pregnant, do not drink alcohol. If you are breastfeeding, be very cautious about drinking alcohol. If you are not pregnant and choose to drink alcohol, do not exceed 1 drink per day. One drink is considered to be 12 ounces (355 mL) of beer, 5 ounces (148 mL) of wine, or 1.5 ounces (44 mL) of liquor.  Avoid use of street drugs. Do not share needles with anyone. Ask for help if you need support or instructions about stopping the use of drugs.  High blood pressure causes heart disease and increases the risk of stroke. Your blood pressure should be checked at least every 1 to 2 years. Ongoing high blood pressure should be treated with medicines if weight loss and exercise are not effective.  If you are 55 to 31 years old, ask your caregiver if you should take aspirin to prevent strokes.  Diabetes   screening involves taking a blood sample to check your fasting blood sugar level. This should be done once every 3 years, after age 45, if you are within normal weight and without risk factors for diabetes. Testing should be considered at a younger age or be carried out more frequently if you are overweight and have at least 1 risk factor for diabetes.  Breast cancer screening is essential preventive care for women. You should practice "breast  self-awareness." This means understanding the normal appearance and feel of your breasts and may include breast self-examination. Any changes detected, no matter how small, should be reported to a caregiver. Women in their 20s and 30s should have a clinical breast exam (CBE) by a caregiver as part of a regular health exam every 1 to 3 years. After age 40, women should have a CBE every year. Starting at age 40, women should consider having a mammography (breast X-ray test) every year. Women who have a family history of breast cancer should talk to their caregiver about genetic screening. Women at a high risk of breast cancer should talk to their caregivers about having magnetic resonance imaging (MRI) and a mammography every year.  The Pap test is a screening test for cervical cancer. A Pap test can show cell changes on the cervix that might become cervical cancer if left untreated. A Pap test is a procedure in which cells are obtained and examined from the lower end of the uterus (cervix).  Women should have a Pap test starting at age 21.  Between ages 21 and 29, Pap tests should be repeated every 2 years.  Beginning at age 30, you should have a Pap test every 3 years as long as the past 3 Pap tests have been normal.  Some women have medical problems that increase the chance of getting cervical cancer. Talk to your caregiver about these problems. It is especially important to talk to your caregiver if a new problem develops soon after your last Pap test. In these cases, your caregiver may recommend more frequent screening and Pap tests.  The above recommendations are the same for women who have or have not gotten the vaccine for human papillomavirus (HPV).  If you had a hysterectomy for a problem that was not cancer or a condition that could lead to cancer, then you no longer need Pap tests. Even if you no longer need a Pap test, a regular exam is a good idea to make sure no other problems are  starting.  If you are between ages 65 and 70, and you have had normal Pap tests going back 10 years, you no longer need Pap tests. Even if you no longer need a Pap test, a regular exam is a good idea to make sure no other problems are starting.  If you have had past treatment for cervical cancer or a condition that could lead to cancer, you need Pap tests and screening for cancer for at least 20 years after your treatment.  If Pap tests have been discontinued, risk factors (such as a new sexual partner) need to be reassessed to determine if screening should be resumed.  The HPV test is an additional test that may be used for cervical cancer screening. The HPV test looks for the virus that can cause the cell changes on the cervix. The cells collected during the Pap test can be tested for HPV. The HPV test could be used to screen women aged 30 years and older, and should   be used in women of any age who have unclear Pap test results. After the age of 30, women should have HPV testing at the same frequency as a Pap test.  Colorectal cancer can be detected and often prevented. Most routine colorectal cancer screening begins at the age of 50 and continues through age 75. However, your caregiver may recommend screening at an earlier age if you have risk factors for colon cancer. On a yearly basis, your caregiver may provide home test kits to check for hidden blood in the stool. Use of a small camera at the end of a tube, to directly examine the colon (sigmoidoscopy or colonoscopy), can detect the earliest forms of colorectal cancer. Talk to your caregiver about this at age 50, when routine screening begins. Direct examination of the colon should be repeated every 5 to 10 years through age 75, unless early forms of pre-cancerous polyps or small growths are found.  Hepatitis C blood testing is recommended for all people born from 1945 through 1965 and any individual with known risks for hepatitis C.  Practice  safe sex. Use condoms and avoid high-risk sexual practices to reduce the spread of sexually transmitted infections (STIs). STIs include gonorrhea, chlamydia, syphilis, trichomonas, herpes, HPV, and human immunodeficiency virus (HIV). Herpes, HIV, and HPV are viral illnesses that have no cure. They can result in disability, cancer, and death. Sexually active women aged 25 and younger should be checked for chlamydia. Older women with new or multiple partners should also be tested for chlamydia. Testing for other STIs is recommended if you are sexually active and at increased risk.  Osteoporosis is a disease in which the bones lose minerals and strength with aging. This can result in serious bone fractures. The risk of osteoporosis can be identified using a bone density scan. Women ages 65 and over and women at risk for fractures or osteoporosis should discuss screening with their caregivers. Ask your caregiver whether you should take a calcium supplement or vitamin D to reduce the rate of osteoporosis.  Menopause can be associated with physical symptoms and risks. Hormone replacement therapy is available to decrease symptoms and risks. You should talk to your caregiver about whether hormone replacement therapy is right for you.  Use sunscreen with sun protection factor (SPF) of 30 or more. Apply sunscreen liberally and repeatedly throughout the day. You should seek shade when your shadow is shorter than you. Protect yourself by wearing long sleeves, pants, a wide-brimmed hat, and sunglasses year round, whenever you are outdoors.  Once a month, do a whole body skin exam, using a mirror to look at the skin on your back. Notify your caregiver of new moles, moles that have irregular borders, moles that are larger than a pencil eraser, or moles that have changed in shape or color.  Stay current with required immunizations.  Influenza. You need a dose every fall (or winter). The composition of the flu vaccine  changes each year, so being vaccinated once is not enough.  Pneumococcal polysaccharide. You need 1 to 2 doses if you smoke cigarettes or if you have certain chronic medical conditions. You need 1 dose at age 65 (or older) if you have never been vaccinated.  Tetanus, diphtheria, pertussis (Tdap, Td). Get 1 dose of Tdap vaccine if you are younger than age 65, are over 65 and have contact with an infant, are a healthcare worker, are pregnant, or simply want to be protected from whooping cough. After that, you need a Td   booster dose every 10 years. Consult your caregiver if you have not had at least 3 tetanus and diphtheria-containing shots sometime in your life or have a deep or dirty wound.  HPV. You need this vaccine if you are a woman age 26 or younger. The vaccine is given in 3 doses over 6 months.  Measles, mumps, rubella (MMR). You need at least 1 dose of MMR if you were born in 1957 or later. You may also need a second dose.  Meningococcal. If you are age 19 to 21 and a first-year college student living in a residence hall, or have one of several medical conditions, you need to get vaccinated against meningococcal disease. You may also need additional booster doses.  Zoster (shingles). If you are age 60 or older, you should get this vaccine.  Varicella (chickenpox). If you have never had chickenpox or you were vaccinated but received only 1 dose, talk to your caregiver to find out if you need this vaccine.  Hepatitis A. You need this vaccine if you have a specific risk factor for hepatitis A virus infection or you simply wish to be protected from this disease. The vaccine is usually given as 2 doses, 6 to 18 months apart.  Hepatitis B. You need this vaccine if you have a specific risk factor for hepatitis B virus infection or you simply wish to be protected from this disease. The vaccine is given in 3 doses, usually over 6 months. Preventive Services / Frequency Ages 19 to 39  Blood  pressure check.** / Every 1 to 2 years.  Lipid and cholesterol check.** / Every 5 years beginning at age 20.  Clinical breast exam.** / Every 3 years for women in their 20s and 30s.  Pap test.** / Every 2 years from ages 21 through 29. Every 3 years starting at age 30 through age 65 or 70 with a history of 3 consecutive normal Pap tests.  HPV screening.** / Every 3 years from ages 30 through ages 65 to 70 with a history of 3 consecutive normal Pap tests.  Hepatitis C blood test.** / For any individual with known risks for hepatitis C.  Skin self-exam. / Monthly.  Influenza immunization.** / Every year.  Pneumococcal polysaccharide immunization.** / 1 to 2 doses if you smoke cigarettes or if you have certain chronic medical conditions.  Tetanus, diphtheria, pertussis (Tdap, Td) immunization. / A one-time dose of Tdap vaccine. After that, you need a Td booster dose every 10 years.  HPV immunization. / 3 doses over 6 months, if you are 26 and younger.  Measles, mumps, rubella (MMR) immunization. / You need at least 1 dose of MMR if you were born in 1957 or later. You may also need a second dose.  Meningococcal immunization. / 1 dose if you are age 19 to 21 and a first-year college student living in a residence hall, or have one of several medical conditions, you need to get vaccinated against meningococcal disease. You may also need additional booster doses.  Varicella immunization.** / Consult your caregiver.  Hepatitis A immunization.** / Consult your caregiver. 2 doses, 6 to 18 months apart.  Hepatitis B immunization.** / Consult your caregiver. 3 doses usually over 6 months. Ages 40 to 64  Blood pressure check.** / Every 1 to 2 years.  Lipid and cholesterol check.** / Every 5 years beginning at age 20.  Clinical breast exam.** / Every year after age 40.  Mammogram.** / Every year beginning at age 40   and continuing for as long as you are in good health. Consult with your  caregiver.  Pap test.** / Every 3 years starting at age 30 through age 65 or 70 with a history of 3 consecutive normal Pap tests.  HPV screening.** / Every 3 years from ages 30 through ages 65 to 70 with a history of 3 consecutive normal Pap tests.  Fecal occult blood test (FOBT) of stool. / Every year beginning at age 50 and continuing until age 75. You may not need to do this test if you get a colonoscopy every 10 years.  Flexible sigmoidoscopy or colonoscopy.** / Every 5 years for a flexible sigmoidoscopy or every 10 years for a colonoscopy beginning at age 50 and continuing until age 75.  Hepatitis C blood test.** / For all people born from 1945 through 1965 and any individual with known risks for hepatitis C.  Skin self-exam. / Monthly.  Influenza immunization.** / Every year.  Pneumococcal polysaccharide immunization.** / 1 to 2 doses if you smoke cigarettes or if you have certain chronic medical conditions.  Tetanus, diphtheria, pertussis (Tdap, Td) immunization.** / A one-time dose of Tdap vaccine. After that, you need a Td booster dose every 10 years.  Measles, mumps, rubella (MMR) immunization. / You need at least 1 dose of MMR if you were born in 1957 or later. You may also need a second dose.  Varicella immunization.** / Consult your caregiver.  Meningococcal immunization.** / Consult your caregiver.  Hepatitis A immunization.** / Consult your caregiver. 2 doses, 6 to 18 months apart.  Hepatitis B immunization.** / Consult your caregiver. 3 doses, usually over 6 months. Ages 65 and over  Blood pressure check.** / Every 1 to 2 years.  Lipid and cholesterol check.** / Every 5 years beginning at age 20.  Clinical breast exam.** / Every year after age 40.  Mammogram.** / Every year beginning at age 40 and continuing for as long as you are in good health. Consult with your caregiver.  Pap test.** / Every 3 years starting at age 30 through age 65 or 70 with a 3  consecutive normal Pap tests. Testing can be stopped between 65 and 70 with 3 consecutive normal Pap tests and no abnormal Pap or HPV tests in the past 10 years.  HPV screening.** / Every 3 years from ages 30 through ages 65 or 70 with a history of 3 consecutive normal Pap tests. Testing can be stopped between 65 and 70 with 3 consecutive normal Pap tests and no abnormal Pap or HPV tests in the past 10 years.  Fecal occult blood test (FOBT) of stool. / Every year beginning at age 50 and continuing until age 75. You may not need to do this test if you get a colonoscopy every 10 years.  Flexible sigmoidoscopy or colonoscopy.** / Every 5 years for a flexible sigmoidoscopy or every 10 years for a colonoscopy beginning at age 50 and continuing until age 75.  Hepatitis C blood test.** / For all people born from 1945 through 1965 and any individual with known risks for hepatitis C.  Osteoporosis screening.** / A one-time screening for women ages 65 and over and women at risk for fractures or osteoporosis.  Skin self-exam. / Monthly.  Influenza immunization.** / Every year.  Pneumococcal polysaccharide immunization.** / 1 dose at age 65 (or older) if you have never been vaccinated.  Tetanus, diphtheria, pertussis (Tdap, Td) immunization. / A one-time dose of Tdap vaccine if you are over   65 and have contact with an infant, are a healthcare worker, or simply want to be protected from whooping cough. After that, you need a Td booster dose every 10 years.  Varicella immunization.** / Consult your caregiver.  Meningococcal immunization.** / Consult your caregiver.  Hepatitis A immunization.** / Consult your caregiver. 2 doses, 6 to 18 months apart.  Hepatitis B immunization.** / Check with your caregiver. 3 doses, usually over 6 months. ** Family history and personal history of risk and conditions may change your caregiver's recommendations. Document Released: 06/29/2001 Document Revised: 07/26/2011  Document Reviewed: 09/28/2010 [employer] Patient Information 2013 [employer].  

## 2012-09-11 NOTE — Progress Notes (Signed)
Subjective:     Gabrielle Rodgers is a 32 y.o. female and is here for a comprehensive physical exam. The patient reports no problems.  History   Social History  . Marital Status: Single    Spouse Name: N/A    Number of Children: N/A  . Years of Education: N/A   Occupational History  . Not on file.   Social History Main Topics  . Smoking status: Never Smoker   . Smokeless tobacco: Never Used  . Alcohol Use: No  . Drug Use: No  . Sexually Active: Yes   Other Topics Concern  . Not on file   Social History Narrative  . No narrative on file   Health Maintenance  Topic Date Due  . Influenza Vaccine  01/15/2013  . Pap Smear  08/29/2015  . Tetanus/tdap  05/26/2021    The following portions of the patient's history were reviewed and updated as appropriate:  She  has a past medical history of No pertinent past medical history; Asherman's syndrome (2005); Complication of anesthesia; and S/P cesarean section (05/29/2011). She  does not have any pertinent problems on file. She  has past surgical history that includes Dilation and curettage of uterus and Cesarean section (05/26/2011). Her family history includes Alcohol abuse in her maternal grandfather; Anesthesia problems in her sister; Cancer in her father, paternal grandfather, and paternal uncle; Colon polyps in her father and paternal grandmother; Diabetes in her maternal grandmother and mother; Fibroids in her mother; Heart disease in her maternal grandmother; and Multiple sclerosis in her maternal aunt. She  reports that she has never smoked. She has never used smokeless tobacco. She reports that she does not drink alcohol or use illicit drugs. She has a current medication list which includes the following prescription(s): levonorgestrel. No current outpatient prescriptions on file prior to visit.   No current facility-administered medications on file prior to visit.   She has No Known Allergies..  Review of Systems Review of  Systems  Constitutional: Negative for activity change, appetite change and fatigue.  HENT: Negative for hearing loss, congestion, tinnitus and ear discharge.  dentist q40m Eyes: Negative for visual disturbance (see optho--- 20/20)  Respiratory: Negative for cough, chest tightness and shortness of breath.   Cardiovascular: Negative for chest pain, palpitations and leg swelling.  Gastrointestinal: Negative for abdominal pain, diarrhea, constipation and abdominal distention.  Genitourinary: Negative for urgency, frequency, decreased urine volume and difficulty urinating.  Musculoskeletal: Negative for back pain, arthralgias and gait problem.  Skin: Negative for color change, pallor and rash.  Neurological: Negative for dizziness, light-headedness, numbness and headaches.  Hematological: Negative for adenopathy. Does not bruise/bleed easily.  Psychiatric/Behavioral: Negative for suicidal ideas, confusion, sleep disturbance, self-injury, dysphoric mood, decreased concentration and agitation.       Objective:    BP 98/64  Pulse 73  Temp(Src) 98.4 F (36.9 C) (Oral)  Ht 5' 5.75" (1.67 m)  Wt 156 lb 12.8 oz (71.124 kg)  BMI 25.5 kg/m2  SpO2 99% General appearance: alert, cooperative, appears stated age and no distress Head: Normocephalic, without obvious abnormality, atraumatic Eyes: conjunctivae/corneas clear. PERRL, EOM's intact. Fundi benign. Ears: normal TM's and external ear canals both ears Nose: Nares normal. Septum midline. Mucosa normal. No drainage or sinus tenderness. Throat: lips, mucosa, and tongue normal; teeth and gums normal Neck: no adenopathy, supple, symmetrical, trachea midline and thyroid not enlarged, symmetric, no tenderness/mass/nodules Back: symmetric, no curvature. ROM normal. No CVA tenderness. Lungs: clear to auscultation bilaterally Breasts: gyn  Heart: regular rate and rhythm, S1, S2 normal, no murmur, click, rub or gallop Abdomen: soft, non-tender; bowel  sounds normal; no masses,  no organomegaly Pelvic: deferred--gyn Extremities: extremities normal, atraumatic, no cyanosis or edema Pulses: 2+ and symmetric Skin: Skin color, texture, turgor normal. No rashes or lesions Lymph nodes: Cervical, supraclavicular, and axillary nodes normal. Neurologic: Alert and oriented X 3, normal strength and tone. Normal symmetric reflexes. Normal coordination and gait Psych-- no depression , no anxiety      Assessment:    Healthy female exam.      Plan:    ghm utd Check labs See After Visit Summary for Counseling Recommendations

## 2012-09-13 LAB — POCT URINALYSIS DIPSTICK
Bilirubin, UA: NEGATIVE
Blood, UA: NEGATIVE
Glucose, UA: NEGATIVE
Nitrite, UA: NEGATIVE
Spec Grav, UA: >=1.03

## 2012-11-18 ENCOUNTER — Encounter (HOSPITAL_COMMUNITY): Payer: Self-pay | Admitting: *Deleted

## 2012-11-18 ENCOUNTER — Emergency Department (HOSPITAL_COMMUNITY)
Admission: EM | Admit: 2012-11-18 | Discharge: 2012-11-18 | Disposition: A | Discharge status: Home / Self Care | Payer: BC Managed Care – PPO | Source: Home / Self Care | Attending: Emergency Medicine | Admitting: Emergency Medicine

## 2012-11-18 DIAGNOSIS — K529 Noninfective gastroenteritis and colitis, unspecified: Secondary | ICD-10-CM

## 2012-11-18 DIAGNOSIS — K5289 Other specified noninfective gastroenteritis and colitis: Secondary | ICD-10-CM

## 2012-11-18 LAB — CBC WITH DIFFERENTIAL/PLATELET
Basophils Absolute: 0 10*3/uL (ref 0.0–0.1)
Eosinophils Relative: 1 % (ref 0–5)
HCT: 42 % (ref 36.0–46.0)
Lymphocytes Relative: 43 % (ref 12–46)
Lymphs Abs: 2.8 10*3/uL (ref 0.7–4.0)
MCV: 84 fL (ref 78.0–100.0)
Neutro Abs: 3.3 10*3/uL (ref 1.7–7.7)
Platelets: 213 10*3/uL (ref 150–400)
RBC: 5 MIL/uL (ref 3.87–5.11)
RDW: 12.8 % (ref 11.5–15.5)
WBC: 6.6 10*3/uL (ref 4.0–10.5)

## 2012-11-18 LAB — POCT I-STAT, CHEM 8
BUN: 12 mg/dL (ref 6–23)
Chloride: 105 mEq/L (ref 96–112)
Creatinine, Ser: 1 mg/dL (ref 0.50–1.10)
Potassium: 3.4 mEq/L — ABNORMAL LOW (ref 3.5–5.1)
Sodium: 139 mEq/L (ref 135–145)

## 2012-11-18 LAB — POCT URINALYSIS DIP (DEVICE)
Glucose, UA: NEGATIVE mg/dL
Hgb urine dipstick: NEGATIVE
Specific Gravity, Urine: 1.025 (ref 1.005–1.030)
Urobilinogen, UA: 0.2 mg/dL (ref 0.0–1.0)

## 2012-11-18 LAB — POCT PREGNANCY, URINE: Preg Test, Ur: NEGATIVE

## 2012-11-18 MED ORDER — DICYCLOMINE HCL 20 MG PO TABS: 20.0000 mg | ORAL_TABLET | Freq: Four times a day (QID) | ORAL | Status: DC

## 2012-11-18 MED ORDER — SODIUM CHLORIDE 0.9 % IV SOLN
INTRAVENOUS | Status: DC
  Administered 2012-11-18: 16:00:00 via INTRAVENOUS

## 2012-11-18 MED ORDER — ONDANSETRON HCL 4 MG/2ML IJ SOLN
INTRAMUSCULAR | Status: AC
  Filled 2012-11-18: qty 2

## 2012-11-18 MED ORDER — ONDANSETRON HCL 4 MG/2ML IJ SOLN
4.0000 mg | Freq: Once | INTRAMUSCULAR | Status: AC
  Administered 2012-11-18: 4 mg via INTRAVENOUS

## 2012-11-18 MED ORDER — ONDANSETRON 8 MG PO TBDP: 8.0000 mg | ORAL_TABLET | Freq: Three times a day (TID) | ORAL | Status: DC | PRN

## 2012-11-18 NOTE — ED Notes (Signed)
1 liter NS infused.  Ginger ale given to attempt PO challenge.

## 2012-11-18 NOTE — ED Notes (Signed)
Reports being at beach this past week; ate several foods on Tuesday; Tuesday night had multiple episodes of vomiting and some slight diarrhea.  Took Imodium to be able to make the drive home, but then couldn't have BM until yesterday (small amount).  Continues with n/v - states unable to keep anything down.  Denies fevers.  C/O generalized intermittent abd cramping.  Pt alert, appears healthy, has make-up on.

## 2012-11-18 NOTE — ED Provider Notes (Signed)
Chief Complaint:   Chief Complaint  Patient presents with  . Vomiting    History of Present Illness:   Gabrielle Rodgers is a 32 year old female who has had a five-day history of nausea, vomiting, and diarrhea. This occurred after a trip to the beach in which she ate lots of seafood, sushi, and steak. There is nothing in particular that appeared to be uncooked, or tasted bad or smell bad. No one else in her party became ill with the exception of her son who had symptoms for less than a day. Her symptoms have not been going on for 5 days. The vomitus is green and occasionally black in color. She denies any bright red blood in the vomitus. She had one diarrheal stool and took 2 Imodium pills and since then has not had a bowel movement up until yesterday. She denies any fever or chills but has had some sweats. She notes generalized, crampy abdominal pain and she feels dehydrated.  Review of Systems:  Other than noted above, the patient denies any of the following symptoms: Systemic:  No fevers, chills, sweats, weight loss or gain, fatigue, or tiredness. ENT:  No nasal congestion, rhinorrhea, or sore throat. Lungs:  No cough, wheezing, or shortness of breath. Cardiac:  No chest pain, syncope, or presyncope. GI:  No abdominal pain, nausea, vomiting, anorexia, diarrhea, constipation, blood in stool or vomitus. GU:  No dysuria, frequency, or urgency.  PMFSH:  Past medical history, family history, social history, meds, and allergies were reviewed.  She has a Mirena in place. Last menstrual period was 3 weeks ago. She is sexually active.  Physical Exam:   Vital signs:  BP 118/76  Pulse 79  Temp(Src) 99 F (37.2 C) (Oral)  Resp 18  SpO2 99%  LMP 10/28/2012 General:  Alert and oriented.  In no distress.  Skin warm and dry.  Good skin turgor, brisk capillary refill. ENT:  No scleral icterus, moist mucous membranes, no oral lesions, pharynx clear. Lungs:  Breath sounds clear and equal bilaterally.  No  wheezes, rales, or rhonchi. Heart:  Rhythm regular, without extrasystoles.  No gallops or murmers. Abdomen:  Soft, flat, and nondistended. She has mild, generalized tenderness to palpation without guarding or rebound. Bowel sounds are hyperactive. Skin: Clear, warm, and dry.  Good turgor.  Brisk capillary refill.  Labs:   Results for orders placed during the hospital encounter of 11/18/12  CBC WITH DIFFERENTIAL      Result Value Range   WBC 6.6  4.0 - 10.5 K/uL   RBC 5.00  3.87 - 5.11 MIL/uL   Hemoglobin 14.4  12.0 - 15.0 g/dL   HCT 95.6  21.3 - 08.6 %   MCV 84.0  78.0 - 100.0 fL   MCH 28.8  26.0 - 34.0 pg   MCHC 34.3  30.0 - 36.0 g/dL   RDW 57.8  46.9 - 62.9 %   Platelets 213  150 - 400 K/uL   Neutrophils Relative % 49  43 - 77 %   Neutro Abs 3.3  1.7 - 7.7 K/uL   Lymphocytes Relative 43  12 - 46 %   Lymphs Abs 2.8  0.7 - 4.0 K/uL   Monocytes Relative 6  3 - 12 %   Monocytes Absolute 0.4  0.1 - 1.0 K/uL   Eosinophils Relative 1  0 - 5 %   Eosinophils Absolute 0.1  0.0 - 0.7 K/uL   Basophils Relative 1  0 - 1 %  Basophils Absolute 0.0  0.0 - 0.1 K/uL  POCT URINALYSIS DIP (DEVICE)      Result Value Range   Glucose, UA NEGATIVE  NEGATIVE mg/dL   Bilirubin Urine SMALL (*) NEGATIVE   Ketones, ur TRACE (*) NEGATIVE mg/dL   Specific Gravity, Urine 1.025  1.005 - 1.030   Hgb urine dipstick NEGATIVE  NEGATIVE   pH 6.5  5.0 - 8.0   Protein, ur 30 (*) NEGATIVE mg/dL   Urobilinogen, UA 0.2  0.0 - 1.0 mg/dL   Nitrite NEGATIVE  NEGATIVE   Leukocytes, UA NEGATIVE  NEGATIVE  POCT PREGNANCY, URINE      Result Value Range   Preg Test, Ur NEGATIVE  NEGATIVE  POCT I-STAT, CHEM 8      Result Value Range   Sodium 139  135 - 145 mEq/L   Potassium 3.4 (*) 3.5 - 5.1 mEq/L   Chloride 105  96 - 112 mEq/L   BUN 12  6 - 23 mg/dL   Creatinine, Ser 1.61  0.50 - 1.10 mg/dL   Glucose, Bld 75  70 - 99 mg/dL   Calcium, Ion 0.96  0.45 - 1.23 mmol/L   TCO2 23  0 - 100 mmol/L   Hemoglobin 16.0 (*)  12.0 - 15.0 g/dL   HCT 40.9 (*) 81.1 - 91.4 %     Course in Urgent Care Center:   She was given IV normal saline, 1 L and IV Zofran 4 mg. Thereafter she felt better. An oral fluid challenge was tried with ginger ale and she was able to keep this down.  Assessment:  The encounter diagnosis was Gastroenteritis.  Probable viral gastroenteritis.  Plan:   1.  The following meds were prescribed:   Discharge Medication List as of 11/18/2012  4:56 PM    START taking these medications   Details  dicyclomine (BENTYL) 20 MG tablet Take 1 tablet (20 mg total) by mouth every 6 (six) hours., Starting 11/18/2012, Until Discontinued, Normal    ondansetron (ZOFRAN ODT) 8 MG disintegrating tablet Take 1 tablet (8 mg total) by mouth every 8 (eight) hours as needed for nausea., Starting 11/18/2012, Until Discontinued, Normal       2.  The patient was instructed in symptomatic care and handouts were given. 3.  The patient was told to return if becoming worse in any way, if no better in 2 or 3 days, and given some red flag symptoms such as persistent vomiting, fever, or any evidence of GI bleeding that would indicate earlier return. 4.  The patient was told to take only sips of clear liquids for the next 24 hours and then advance to a b.r.a.t. Diet. 5.  Follow up here if necessary.      Reuben Likes, MD 11/18/12 [id number]

## 2013-04-06 ENCOUNTER — Ambulatory Visit (INDEPENDENT_AMBULATORY_CARE_PROVIDER_SITE_OTHER): Payer: BC Managed Care – PPO | Admitting: Family Medicine

## 2013-04-06 ENCOUNTER — Encounter: Payer: Self-pay | Admitting: Family Medicine

## 2013-04-06 VITALS — BP 108/60 | HR 92 | Wt 172.8 lb

## 2013-04-06 DIAGNOSIS — A499 Bacterial infection, unspecified: Secondary | ICD-10-CM

## 2013-04-06 DIAGNOSIS — N760 Acute vaginitis: Secondary | ICD-10-CM

## 2013-04-06 DIAGNOSIS — B9689 Other specified bacterial agents as the cause of diseases classified elsewhere: Secondary | ICD-10-CM

## 2013-04-06 MED ORDER — METRONIDAZOLE 0.75 % VA GEL: 1.0000 | Freq: Every day | VAGINAL | Status: DC

## 2013-04-06 NOTE — Progress Notes (Signed)
  Subjective:    Patient ID: Gabrielle Rodgers, female    DOB: 02/23/1981, 32 y.o.   MRN: [id number]  HPI Pt here c/o vaginal d/c x several weeks.  No abd pain.  No fever. Thin white d/c witn odor.     Review of Systems    as above Objective:   Physical Exam BP 108/60  Pulse 92  Wt 172 lb 12.8 oz (78.382 kg)  SpO2 99%  LMP 03/23/2013  Breastfeeding? No General appearance: alert, cooperative, appears stated age and no distress Abdomen: soft, non-tender; bowel sounds normal; no masses,  no organomegaly Pelvic: cervix normal in appearance, external genitalia normal, no cervical motion tenderness, positive findings: positive whiff test or vaginal discharge:  white and malodorous and uterus normal size, shape, and consistency       Assessment & Plan:

## 2013-04-06 NOTE — Progress Notes (Signed)
Pre visit review using our clinic review tool, if applicable. No additional management support is needed unless otherwise documented below in the visit note. 

## 2013-04-06 NOTE — Patient Instructions (Signed)
Bacterial Vaginosis Bacterial vaginosis (BV) is a vaginal infection where the normal balance of bacteria in the vagina is disrupted. The normal balance is then replaced by an overgrowth of certain bacteria. There are several different kinds of bacteria that can cause BV. BV is the most common vaginal infection in women of childbearing age. CAUSES   The cause of BV is not fully understood. BV develops when there is an increase or imbalance of harmful bacteria.  Some activities or behaviors can upset the normal balance of bacteria in the vagina and put women at increased risk including:  Having a new sex partner or multiple sex partners.  Douching.  Using an intrauterine device (IUD) for contraception.  It is not clear what role sexual activity plays in the development of BV. However, women that have never had sexual intercourse are rarely infected with BV. Women do not get BV from toilet seats, bedding, swimming pools or from touching objects around them.  SYMPTOMS   Grey vaginal discharge.  A fish-like odor with discharge, especially after sexual intercourse.  Itching or burning of the vagina and vulva.  Burning or pain with urination.  Some women have no signs or symptoms at all. DIAGNOSIS  Your caregiver must examine the vagina for signs of BV. Your caregiver will perform lab tests and look at the sample of vaginal fluid through a microscope. They will look for bacteria and abnormal cells (clue cells), a pH test higher than 4.5, and a positive amine test all associated with BV.  RISKS AND COMPLICATIONS   Pelvic inflammatory disease (PID).  Infections following gynecology surgery.  Developing HIV.  Developing herpes virus. TREATMENT  Sometimes BV will clear up without treatment. However, all women with symptoms of BV should be treated to avoid complications, especially if gynecology surgery is planned. Female partners generally do not need to be treated. However, BV may spread  between female sex partners so treatment is helpful in preventing a recurrence of BV.   BV may be treated with antibiotics. The antibiotics come in either pill or vaginal cream forms. Either can be used with nonpregnant or pregnant women, but the recommended dosages differ. These antibiotics are not harmful to the baby.  BV can recur after treatment. If this happens, a second round of antibiotics will often be prescribed.  Treatment is important for pregnant women. If not treated, BV can cause a premature delivery, especially for a pregnant woman who had a premature birth in the past. All pregnant women who have symptoms of BV should be checked and treated.  For chronic reoccurrence of BV, treatment with a type of prescribed gel vaginally twice a week is helpful. HOME CARE INSTRUCTIONS   Finish all medication as directed by your caregiver.  Do not have sex until treatment is completed.  Tell your sexual partner that you have a vaginal infection. They should see their caregiver and be treated if they have problems, such as a mild rash or itching.  Practice safe sex. Use condoms. Only have 1 sex partner. PREVENTION  Basic prevention steps can help reduce the risk of upsetting the natural balance of bacteria in the vagina and developing BV:  Do not have sexual intercourse (be abstinent).  Do not douche.  Use all of the medicine prescribed for treatment of BV, even if the signs and symptoms go away.  Tell your sex partner if you have BV. That way, they can be treated, if needed, to prevent reoccurrence. SEEK MEDICAL CARE IF:     Your symptoms are not improving after 3 days of treatment.  You have increased discharge, pain, or fever. MAKE SURE YOU:   Understand these instructions.  Will watch your condition.  Will get help right away if you are not doing well or get worse. FOR MORE INFORMATION  Division of STD Prevention (DSTDP), Centers for Disease Control and Prevention:  www.cdc.gov/std American Social Health Association (ASHA): www.ashastd.org  Document Released: 05/03/2005 Document Revised: 07/26/2011 Document Reviewed: 12/13/2012 [employer] Patient Information 2014 [employer].  

## 2013-04-07 LAB — WET PREP BY MOLECULAR PROBE
Candida species: NEGATIVE
Gardnerella vaginalis: NEGATIVE
Trichomonas vaginosis: NEGATIVE

## 2013-04-08 DIAGNOSIS — N760 Acute vaginitis: Secondary | ICD-10-CM | POA: Insufficient documentation

## 2013-04-08 NOTE — Assessment & Plan Note (Signed)
Vaginal culture sent  + whiff and d/c--metrogel sent to pharmacy

## 2013-05-09 ENCOUNTER — Ambulatory Visit (INDEPENDENT_AMBULATORY_CARE_PROVIDER_SITE_OTHER): Payer: BC Managed Care – PPO | Admitting: Internal Medicine

## 2013-05-09 ENCOUNTER — Encounter: Payer: Self-pay | Admitting: Internal Medicine

## 2013-05-09 VITALS — BP 98/63 | HR 60 | Temp 98.4°F | Wt 167.0 lb

## 2013-05-09 DIAGNOSIS — J209 Acute bronchitis, unspecified: Secondary | ICD-10-CM

## 2013-05-09 MED ORDER — AZITHROMYCIN 250 MG PO TABS: ORAL_TABLET | ORAL | Status: DC

## 2013-05-09 NOTE — Progress Notes (Signed)
Pre visit review using our clinic review tool, if applicable. No additional management support is needed unless otherwise documented below in the visit note. 

## 2013-05-09 NOTE — Patient Instructions (Signed)
Rest, fluids , tylenol For cough, take Mucinex DM twice a day as needed  For congestion use OTC Nasocort: 2 nasal sprays on each side of the nose daily until you feel better Take the antibiotic as prescribed  (zithromax) Call if no better in few days Call anytime if the symptoms are severe  

## 2013-05-09 NOTE — Progress Notes (Signed)
   Subjective:    Patient ID: Gabrielle Rodgers, female    DOB: Jun 05, 1980, 32 y.o.   MRN: [id number]  HPI Acute visit Symptoms started 3 weeks ago: Cough with production of thick yellow sputum Nasal congestion and some postnasal dripping which is worse at night. Her son has similar symptoms and was diagnosed with  sinusitis today. Some headaches.  Past Medical History  Diagnosis Date  . No pertinent past medical history   . 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 Complication of anesthesia     had itching after anesthesia in past  . S/P cesarean section 05/29/2011   Past Surgical History  Procedure Laterality Date  . Dilation and curettage of uterus    . Cesarean section  05/26/2011    Procedure: CESAREAN SECTION;  Surgeon: Bing Plume, MD;  Location: [facility];  Service: Gynecology;  Laterality: N/A;  Primary Cesarean Section Delivery Boy @ [phone], Apgars 9/10     Review of Systems Does not recall having fever Denies nausea or vomiting. No myalgias No  chest congestion.     Objective:   Physical Exam  BP 98/63  Pulse 60  Temp(Src) 98.4 F (36.9 C)  Wt 167 lb (75.751 kg)  SpO2 96% General -- alert, well-developed, NAD.  HEENT-- Not pale. TMs normal, throat symmetric, no redness or discharge. Face symmetric, sinuses not tender to palpation. Nose w/ minimal congested.  Lungs -- normal respiratory effort, no intercostal retractions, no accessory muscle use, and normal breath sounds.  Heart-- normal rate, regular rhythm, no murmur.  Extremities-- no pretibial edema bilaterally  Neurologic--  alert & oriented X3.   Psych-- Cognition and judgment appear intact. Cooperative with normal attention span and concentration. No anxious appearing , no depressed appearing.     Assessment & Plan:  Cough, Persisting cough with sputum production, likely atypical bronchitis. Plan: see instructions

## 2013-05-11 ENCOUNTER — Encounter: Payer: Self-pay | Admitting: Internal Medicine

## 2013-11-01 ENCOUNTER — Ambulatory Visit (INDEPENDENT_AMBULATORY_CARE_PROVIDER_SITE_OTHER): Payer: BC Managed Care – PPO | Admitting: Internal Medicine

## 2013-11-01 ENCOUNTER — Encounter: Payer: Self-pay | Admitting: Internal Medicine

## 2013-11-01 VITALS — BP 105/69 | HR 88 | Temp 97.9°F | Wt 168.0 lb

## 2013-11-01 DIAGNOSIS — B029 Zoster without complications: Secondary | ICD-10-CM

## 2013-11-01 MED ORDER — VALACYCLOVIR HCL 1 G PO TABS: 1000.0000 mg | ORAL_TABLET | Freq: Three times a day (TID) | ORAL | Status: AC

## 2013-11-01 MED ORDER — BETAMETHASONE VALERATE 0.1 % EX OINT: 1.0000 "application " | TOPICAL_OINTMENT | Freq: Two times a day (BID) | CUTANEOUS | Status: AC

## 2013-11-01 NOTE — Progress Notes (Signed)
   Subjective:    Patient ID: Gabrielle Rodgers, female    DOB: Sep 02, 1980, 33 y.o.   MRN: [id number]  DOS:  11/01/2013 Type of  Visit: acute History: Developed a small rash and  itching on the left side of the back 5 days ago, 3 days ago the rash got worse and itching increased .   ROS Otherwise feels well, no fever chills, no actual pain.  Past Medical History  Diagnosis Date  . No pertinent past medical history   . 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 Complication of anesthesia     had itching after anesthesia in past  . S/P cesarean section 05/29/2011    Past Surgical History  Procedure Laterality Date  . Dilation and curettage of uterus    . Cesarean section  05/26/2011    Procedure: CESAREAN SECTION;  Surgeon: Melina Schools, MD;  Location: [facility];  Service: Gynecology;  Laterality: N/A;  Primary Cesarean Section Delivery Boy @ [phone], Apgars 9/10    History   Social History  . Marital Status: Single    Spouse Name: N/A    Number of Children: N/A  . Years of Education: N/A   Occupational History  . Not on file.   Social History Main Topics  . Smoking status: Never Smoker   . Smokeless tobacco: Never Used  . Alcohol Use: No  . Drug Use: No  . Sexual Activity: Yes    Birth Control/ Protection: IUD   Other Topics Concern  . Not on file   Social History Narrative  . No narrative on file        Medication List       This list is accurate as of: 11/01/13  3:49 PM.  Always use your most recent med list.               betamethasone valerate ointment 0.1 %  Commonly known as:  VALISONE  Apply 1 application topically 2 (two) times daily.     NEXPLANON [place]  Inject into the skin.     valACYclovir 1000 MG tablet  Commonly known as:  VALTREX  Take 1 tablet (1,000 mg total) by mouth 3 (three) times daily.           Objective:   Physical Exam  Constitutional: She appears well-developed and well-nourished. No distress.  Skin:  She is not diaphoretic.      BP 105/69  Pulse 88  Temp(Src) 97.9 F (36.6 C)  Wt 168 lb (76.204 kg)  SpO2 100%       Assessment & Plan:  Shingles, Although the lesions are not painful but itchy, symptoms are most consistent with shingles. Plan: Valtrex, see instructions. Information about the diagnosis was provided to the pt

## 2013-11-01 NOTE — Progress Notes (Signed)
Pre visit review using our clinic review tool, if applicable. No additional management support is needed unless otherwise documented below in the visit note. 

## 2013-11-01 NOTE — Patient Instructions (Signed)
Take Valtrex for one week use the ointment 2 times a day as needed for itching. Also Claritin OTC 10 mg daily as needed for itching.  Call if the rash is not gradually improving or if it gets worse      Shingles Shingles (herpes zoster) is an infection that is caused by the same virus that causes chickenpox (varicella). The infection causes a painful skin rash and fluid-filled blisters, which eventually break open, crust over, and heal. It may occur in any area of the body, but it usually affects only one side of the body or face. The pain of shingles usually lasts about 1 month. However, some people with shingles may develop long-term (chronic) pain in the affected area of the body. Shingles often occurs many years after the person had chickenpox. It is more common:  In people older than 50 years.  In people with weakened immune systems, such as those with HIV, AIDS, or cancer.  In people taking medicines that weaken the immune system, such as transplant medicines.  In people under great stress. CAUSES  Shingles is caused by the varicella zoster virus (VZV), which also causes chickenpox. After a person is infected with the virus, it can remain in the person's body for years in an inactive state (dormant). To cause shingles, the virus reactivates and breaks out as an infection in a nerve root. The virus can be spread from person to person (contagious) through contact with open blisters of the shingles rash. It will only spread to people who have not had chickenpox. When these people are exposed to the virus, they may develop chickenpox. They will not develop shingles. Once the blisters scab over, the person is no longer contagious and cannot spread the virus to others. SYMPTOMS  Shingles shows up in stages. The initial symptoms may be pain, itching, and tingling in an area of the skin. This pain is usually described as burning, stabbing, or throbbing.In a few days or weeks, a painful red  rash will appear in the area where the pain, itching, and tingling were felt. The rash is usually on one side of the body in a band or belt-like pattern. Then, the rash usually turns into fluid-filled blisters. They will scab over and dry up in approximately 2-3 weeks. Flu-like symptoms may also occur with the initial symptoms, the rash, or the blisters. These may include:  Fever.  Chills.  Headache.  Upset stomach. DIAGNOSIS  Your caregiver will perform a skin exam to diagnose shingles. Skin scrapings or fluid samples may also be taken from the blisters. This sample will be examined under a microscope or sent to a lab for further testing. TREATMENT  There is no specific cure for shingles. Your caregiver will likely prescribe medicines to help you manage the pain, recover faster, and avoid long-term problems. This may include antiviral drugs, anti-inflammatory drugs, and pain medicines. HOME CARE INSTRUCTIONS   Take a cool bath or apply cool compresses to the area of the rash or blisters as directed. This may help with the pain and itching.   Only take over-the-counter or prescription medicines as directed by your caregiver.   Rest as directed by your caregiver.  Keep your rash and blisters clean with mild soap and cool water or as directed by your caregiver.  Do not pick your blisters or scratch your rash. Apply an anti-itch cream or numbing creams to the affected area as directed by your caregiver.  Keep your shingles rash covered with a  loose bandage (dressing).  Avoid skin contact with:  Babies.   Pregnant women.   Children with eczema.   Elderly people with transplants.   People with chronic illnesses, such as leukemia or AIDS.   Wear loose-fitting clothing to help ease the pain of material rubbing against the rash.  Keep all follow-up appointments with your caregiver.If the area involved is on your face, you may receive a referral for follow-up to a  specialist, such as an eye doctor (ophthalmologist) or an ear, nose, and throat (ENT) doctor. Keeping all follow-up appointments will help you avoid eye complications, chronic pain, or disability.  SEEK IMMEDIATE MEDICAL CARE IF:   You have facial pain, pain around the eye area, or loss of feeling on one side of your face.  You have ear pain or ringing in your ear.  You have loss of taste.  Your pain is not relieved with prescribed medicines.   Your redness or swelling spreads.   You have more pain and swelling.  Your condition is worsening or has changed.   You have a feveror persistent symptoms for more than 2-3 days.  You have a fever and your symptoms suddenly get worse. MAKE SURE YOU:  Understand these instructions.  Will watch your condition.  Will get help right away if you are not doing well or get worse. Document Released: 05/03/2005 Document Revised: 01/26/2012 Document Reviewed: 12/16/2011 [facility] Patient Information 2015 [place], [place]. This information is not intended to replace advice given to you by your health care provider. Make sure you discuss any questions you have with your health care provider.

## 2014-03-18 ENCOUNTER — Encounter: Payer: Self-pay | Admitting: Internal Medicine
